# Patient Record
Sex: Male | Born: 1950 | Race: White | Hispanic: No | Marital: Married | Smoking: Former smoker
Health system: Southern US, Community
[De-identification: ages and names within clinical notes are randomized; demographics above are authoritative.]

## PROBLEM LIST (undated history)

## (undated) DIAGNOSIS — K635 Polyp of colon: Secondary | ICD-10-CM

## (undated) DIAGNOSIS — K8020 Calculus of gallbladder without cholecystitis without obstruction: Secondary | ICD-10-CM

## (undated) DIAGNOSIS — M1990 Unspecified osteoarthritis, unspecified site: Secondary | ICD-10-CM

## (undated) DIAGNOSIS — K5790 Diverticulosis of intestine, part unspecified, without perforation or abscess without bleeding: Secondary | ICD-10-CM

## (undated) DIAGNOSIS — E785 Hyperlipidemia, unspecified: Secondary | ICD-10-CM

## (undated) DIAGNOSIS — I2510 Atherosclerotic heart disease of native coronary artery without angina pectoris: Secondary | ICD-10-CM

## (undated) DIAGNOSIS — F419 Anxiety disorder, unspecified: Secondary | ICD-10-CM

## (undated) DIAGNOSIS — I10 Essential (primary) hypertension: Secondary | ICD-10-CM

## (undated) DIAGNOSIS — H269 Unspecified cataract: Secondary | ICD-10-CM

## (undated) DIAGNOSIS — E039 Hypothyroidism, unspecified: Secondary | ICD-10-CM

## (undated) HISTORY — DX: Atherosclerotic heart disease of native coronary artery without angina pectoris: I25.10

## (undated) HISTORY — DX: Unspecified osteoarthritis, unspecified site: M19.90

## (undated) HISTORY — PX: OTHER SURGICAL HISTORY: SHX169

## (undated) HISTORY — DX: Hypothyroidism, unspecified: E03.9

## (undated) HISTORY — DX: Hyperlipidemia, unspecified: E78.5

## (undated) HISTORY — PX: COLONOSCOPY: SHX174

## (undated) HISTORY — PX: HERNIA REPAIR: SHX51

## (undated) HISTORY — DX: Diverticulosis of intestine, part unspecified, without perforation or abscess without bleeding: K57.90

## (undated) HISTORY — PX: EYE SURGERY: SHX253

## (undated) HISTORY — PX: CHOLECYSTECTOMY: SHX55

## (undated) HISTORY — DX: Polyp of colon: K63.5

## (undated) HISTORY — DX: Essential (primary) hypertension: I10

## (undated) HISTORY — DX: Calculus of gallbladder without cholecystitis without obstruction: K80.20

## (undated) HISTORY — DX: Anxiety disorder, unspecified: F41.9

## (undated) HISTORY — DX: Unspecified cataract: H26.9

---

## 1991-08-17 HISTORY — PX: APPENDECTOMY: SHX54

## 2002-09-14 ENCOUNTER — Ambulatory Visit (HOSPITAL_BASED_OUTPATIENT_CLINIC_OR_DEPARTMENT_OTHER): Admission: RE | Admit: 2002-09-14 | Discharge: 2002-09-14 | Payer: Self-pay | Admitting: Orthopedic Surgery

## 2006-07-01 ENCOUNTER — Ambulatory Visit (HOSPITAL_COMMUNITY): Admission: RE | Admit: 2006-07-01 | Discharge: 2006-07-01 | Payer: Self-pay | Admitting: Surgery

## 2007-07-17 ENCOUNTER — Ambulatory Visit (HOSPITAL_COMMUNITY): Admission: RE | Admit: 2007-07-17 | Discharge: 2007-07-17 | Payer: Self-pay | Admitting: Family Medicine

## 2011-01-01 NOTE — Op Note (Signed)
Timothy Timothy Anthony, Timothy Anthony NO.:  [id number]   MEDICAL RECORD NO.:  [id number]          PATIENT TYPE:  AMB   LOCATION:  DAY                          FACILITY:  [facility]   PHYSICIAN:  Thornton Park. Daphine Deutscher, MD  DATE OF BIRTH:  Dec 28, 1950   DATE OF PROCEDURE:  07/01/2006  DATE OF DISCHARGE:                                 OPERATIVE REPORT   PREOP DIAGNOSIS:  Right lower quadrant abdominal pain in an old hernia  repair.  Timothy Timothy Anthony had a previous ruptured appendix repaired through a  paramedian incision; and developed a series of hernias in that location.  In  1995 this was repaired with mesh at some other Hospital; and he subsequently  came to be cared for 10 years later by Dr. Wilber Bihari in [place],  [place] where he underwent an incisional hernia repair with Marlex  mesh.  Dr. Vonita Moss described a large sac with a hernia that was about 12 cm  long; that he went ahead and freed up and closed, with a running continuous  #1 Novofil.  The Marlex mesh was then sutured on top of that to reinforce  that.  Timothy Timothy Anthony had seen me regarding pain that he would develop after  being on his feet for long periods of time.  He was concerned about this  hernia repair and what could be causing his pain.   POSTOPERATIVE DIAGNOSIS:  Probable fascial tearing at site of running  Novofil suture.   PROCEDURE:  Laparoscopy and removal of continuous suture.   SURGEON:  Thornton Park. Daphine Deutscher, MD   ANESTHESIA:  General endotracheal.   DESCRIPTION OF PROCEDURE:  Timothy Timothy Anthony was taken to room #1 on Friday  afternoon, July 01, 2006 and given general anesthesia.  The abdomen was  prepped with Techni-Care and draped sterilely.  I did shave the right lower  quadrant and a spot in the left upper quadrant from the OptiVu.  An 11-mm  OptiVu was used; and the abdomen was entered without difficulty and  inflated.  Two other little 5-mm ports were placed below that on the left  side.  I then  surveyed the abdomen; and then saw on the left side where I  had marked his area for pain, this running continuous suture that, in spots,  was pulling through; and could no doubt be causing pain similar to the  cheese cutter effect, where the permanent suture cuts through the fascia.   I found a knot in an area where this seemed to have torn through the most;  and I cut out that knot, freeing both ends.  I then removed this Prolene  from most of the distance of this closure.  This seemed to remove the knots  as for pain in the area that he was most tender.  I went ahead and injected  the lateral most part with some Marcaine.  Everything else appeared to be  intact.  Although this area is somewhat attenuated because the muscle is  pretty well gone; and there is mesh overlying it.  I did not see a discrete  hernia for which I would do any specific surgical repair.  Following injection, I deflated the abdomen and closed the wounds with 4-0  Vicryl.  The patient seemed to tolerate the procedure well.  He will be  given Percocet 5/325 (#30) to take for pain; and he will be followed up in  the office in about 3-4 weeks.      Thornton Park Daphine Deutscher, MD  Electronically Signed     MBM/MEDQ  D:  07/01/2006  T:  07/01/2006  Job:  [id number]

## 2011-01-01 NOTE — Op Note (Signed)
NAMECHASTIN, RIESGO NO.:  [id number]   MEDICAL RECORD NO.:  [id number]                   PATIENT TYPE:  AMB   LOCATION:  DSC                                  FACILITY:  [facility]   PHYSICIAN:  Katy Fitch. Naaman Plummer., M.D.          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 Date of injury  approximately six weeks prior.   POSTOPERATIVE DIAGNOSES:  Chronic extensor lag right index finger status  post laceration dorsal aspect of right index finger DIP joint. Date of  injury approximately six weeks prior.   OPERATION:  Delayed reconstruction of right index finger distal extensor  tendon overlying distal metaphysis of middle phalanx with 0.045 inch  Kirschner wire fixation of index finger at 5 degrees hyperextension at DIP  joint.   SURGEON:  Katy Fitch. Sypher, M.D.   ASSISTANT:  Jonni Sanger, P.A.   ANESTHESIA:  General LMA supervised by Janetta Hora. Gelene Mink, M.D.   INDICATIONS FOR PROCEDURE:  Timothy Anthony is a 60 year old gentleman who is  employed by a Nurse, learning disability as a Hospital doctor.   Approximately six weeks prior, he sustained a laceration to the dorsal  aspect of his right index finger proximal to the DIP joint. He did not seek  medical attention and allow this wound to heal. He was noted to have a  chronic extensor lag and ultimately sought medical attention and is referred  for hand surgery consult. He was noted to have signs of a chronic extensor  laceration. X-rays demonstrated no sign of osteomyelitis or significant bone  or joint pathology.   We recommended delayed reconstruction of his extensor at this time.   Preoperatively he was advised to anticipate Kirschner wire internal fixation  of the DIP joint at 5  degrees hyperextension to allow satisfactory wound  contracture/star contracture to prevent chronic extensor lag with a distal  extensor repair.   DESCRIPTION OF PROCEDURE:  Jak Haggar was brought to the operating room  and placed in the supine position on the operating table.   Following induction of general anesthesia, the right arm was prepped with  Betadine solution and sterilely draped.   On exsanguination of the limb with an Esmarch bandage, a total tourniquet on  the proximal brachial was inflated to 220 mmHg. The procedure commenced with  a curvilinear excision exposing the extensor mechanism from the diaphysis of  the middle phalanx distally to the nail matrix.   A considerable amount of scar was noted. The scar was carefully dissected  revealing the proximal stump of the tendon at the distal metaphysis of the  middle phalanx. The distal tendon was noted just proximal to the DIP joint.  The area of tendon gap was carefully resected with a scalpel and the  proximal tendon stump tenolysed with the aid of fine tenotomy scissors.   The proximal tendon stump was mobilized followed by placement of multiple  grasping sutures of 4-0 Mersilene and a running finishing suture of 4-0  Mersilene repairing the tendon anatomically. The DIP joint was hyperextended  5 degrees and fixed with a 0.045 inch Kirschner wire placed with a mini  driver.   Satisfactory internal fixation was achieved.   The tendon repair was finished followed by repair of the skin with  intradermal 2-0 Prolene suture.   A compressive dressing applied with Xero-Plus sterile gauze and Coban as  well as an __________ splint maintaining the PIP joint in full extension and  the DIP joint in full extension.   There were no apparent complications.   Mr. Stutsman tolerated the surgery and anesthesia well. He was awakened from  anesthesia and transferred to the recovery room with stable vital signs. He  was given  prescriptions for Percocet 5 mg, 1-2 tablets p.o. q. 4-6 hours  p.r.n. pain, 20 tablets without refill, also Keflex 500 mg one p.o. q. 8  hours x4 days as prophylactic antibiotic.                                               Katy Fitch Naaman Plummer., M.D.    RVS/MEDQ  D:  09/14/2002  T:  09/14/2002  Job:  [id number]

## 2016-01-13 ENCOUNTER — Ambulatory Visit (INDEPENDENT_AMBULATORY_CARE_PROVIDER_SITE_OTHER): Payer: Medicare Other | Admitting: Pediatrics

## 2016-01-13 ENCOUNTER — Encounter: Payer: Self-pay | Admitting: Pediatrics

## 2016-01-13 VITALS — BP 140/86 | HR 64 | Temp 98.3°F | Resp 16 | Ht 72.64 in | Wt 246.0 lb

## 2016-01-13 DIAGNOSIS — T7800XA Anaphylactic reaction due to unspecified food, initial encounter: Secondary | ICD-10-CM | POA: Insufficient documentation

## 2016-01-13 DIAGNOSIS — K219 Gastro-esophageal reflux disease without esophagitis: Secondary | ICD-10-CM | POA: Diagnosis not present

## 2016-01-13 HISTORY — DX: Anaphylactic reaction due to unspecified food, initial encounter: T78.00XA

## 2016-01-13 HISTORY — DX: Gastro-esophageal reflux disease without esophagitis: K21.9

## 2016-01-13 NOTE — Patient Instructions (Addendum)
Avoid peanuts until we can do a gradual oral challenge If you have an allergic reaction take Benadryl 50 mg every 4 hours and if you have life-threatening symptoms inject  with EpiPen 0.3 mg Avoid Aleve, ibuprofen, aspirin and related compounds.. You may use acetaminophen or Tylenol If you need a nonsteroidal medication for pain or arthritis, we could try a gradual oral challenge to meloxicam or Celebrex

## 2016-01-13 NOTE — Progress Notes (Signed)
[address] [place] [address] Dept: [phone]  New Patient Note  Patient ID: Timothy Anthony, male    DOB: 06/18/1951  Age: 65 y.o. MRN: [id number] Date of Office Visit: 01/13/2016 Referring provider: Jamal CollinSuzanne Hedgecock, PA-C [address] [place], [place] [address]    Chief Complaint: Allergic Reaction and Food Intolerance  HPI Timothy Anthony presents for evaluation of 3 different episodes when he ate peanut butter on  one occasion , and peanuts from a  bag on 2 other occasions and developed abdominal cramps and nausea. For the past 9 months he has had a several episodes of chest tightness and abdominal pain after taking Aleve. He has a history of mild gastroesophageal reflux. He has not had eczema, asthma, or urticaria. With the episodes with Aleve he did not have urticaria.  Review of Systems  Constitutional: Negative.   HENT: Negative.   Eyes: Negative.   Respiratory: Negative.   Cardiovascular: Negative.   Gastrointestinal:       Gastroesophageal reflux. Appendectomy. 6 repairs of an abdominal hernia  Genitourinary: Negative.   Musculoskeletal: Negative.   Skin: Negative.   Neurological: Negative.   Endo/Heme/Allergies:       Hypothyroidism. No diabetes  Psychiatric/Behavioral: Negative.     Outpatient Encounter Prescriptions as of 01/13/2016  Medication Sig  . levothyroxine (SYNTHROID, LEVOTHROID) 175 MCG tablet Take by mouth.  . Omega-3 Fatty Acids (FISH OIL) 1000 MG CAPS Take by mouth.   No facility-administered encounter medications on file as of 01/13/2016.     Drug Allergies:  No Known Allergies  Family History: Link's family history is negative for Allergic rhinitis, Angioedema, Asthma, Eczema, Immunodeficiency, and Urticaria..  Social and environmental.. There are no pets in the home. He is not exposed to cigarette smoking. He is disabled because of his surgeries. He smoked cigarettes for 8 years in the past averaging about half a pack  a day  Physical Exam: BP 140/86 mmHg  Pulse 64  Temp(Src) 98.3 F (36.8 C) (Oral)  Resp 16  Ht 6' 0.64" (1.845 m)  Wt 246 lb 0.5 oz (111.6 kg)  BMI 32.78 kg/m2   Physical Exam  Constitutional: He is oriented to person, place, and time. He appears well-developed and well-nourished.  HENT:  Eyes normal. Ears normal. Nose normal. Pharynx normal.  Neck: Neck supple. No thyromegaly present.  Cardiovascular:  S1 and S2 normal no murmurs  Pulmonary/Chest:  Clear to percussion auscultation  Abdominal: Soft. There is no tenderness (no  hepatosplenomegaly).  Lymphadenopathy:    He has no cervical adenopathy.  Neurological: He is alert and oriented to person, place, and time.  Skin:  Clear  Psychiatric: He has a normal mood and affect. His behavior is normal. Judgment and thought content normal.  Vitals reviewed.   Diagnostics: Allergy skin testing to foods it did not show a significant reactivity. Skin testing to peanut was negative   Assessment Assessment and Plan: 1. Allergy with anaphylaxis due to food, initial encounter   2. Gastroesophageal reflux disease without esophagitis   3 .    Adverse reaction to Aleve but no urticaria so that a gradual oral challenge in the future to meloxicam or Celebrex could be done.      Patient Instructions  Avoid peanuts until we can do a gradual oral challenge If you have an allergic reaction take Benadryl 50 mg every 4 hours and if you have life-threatening symptoms inject  with EpiPen 0.3 mg Avoid Aleve, ibuprofen, aspirin  and related compounds.. You may use acetaminophen or Tylenol If you need a nonsteroidal medication for pain or arthritis, we could try a gradual oral challenge to meloxicam    Return if symptoms worsen or fail to improve.   Thank you for the opportunity to care for this patient.  Please do not hesitate to contact me with questions.  Tonette Bihari, M.D.  [facility] [address] [place], [place] [address] [phone]

## 2018-01-31 DIAGNOSIS — E039 Hypothyroidism, unspecified: Secondary | ICD-10-CM

## 2018-01-31 HISTORY — DX: Hypothyroidism, unspecified: E03.9

## 2019-05-28 DIAGNOSIS — R143 Flatulence: Secondary | ICD-10-CM | POA: Insufficient documentation

## 2019-05-28 HISTORY — DX: Flatulence: R14.3

## 2020-08-07 DIAGNOSIS — E782 Mixed hyperlipidemia: Secondary | ICD-10-CM

## 2020-08-07 HISTORY — DX: Mixed hyperlipidemia: E78.2

## 2021-08-24 DIAGNOSIS — K439 Ventral hernia without obstruction or gangrene: Secondary | ICD-10-CM

## 2021-08-24 DIAGNOSIS — I10 Essential (primary) hypertension: Secondary | ICD-10-CM | POA: Insufficient documentation

## 2021-08-24 HISTORY — DX: Essential (primary) hypertension: I10

## 2021-08-24 HISTORY — DX: Ventral hernia without obstruction or gangrene: K43.9

## 2022-08-13 ENCOUNTER — Emergency Department (HOSPITAL_BASED_OUTPATIENT_CLINIC_OR_DEPARTMENT_OTHER)
Admission: EM | Admit: 2022-08-13 | Discharge: 2022-08-13 | Disposition: A | Discharge status: Home / Self Care | Payer: Medicare Other | Attending: Emergency Medicine | Admitting: Emergency Medicine

## 2022-08-13 ENCOUNTER — Emergency Department (HOSPITAL_BASED_OUTPATIENT_CLINIC_OR_DEPARTMENT_OTHER): Payer: Medicare Other

## 2022-08-13 ENCOUNTER — Encounter (HOSPITAL_BASED_OUTPATIENT_CLINIC_OR_DEPARTMENT_OTHER): Payer: Self-pay | Admitting: Pediatrics

## 2022-08-13 ENCOUNTER — Other Ambulatory Visit: Payer: Self-pay

## 2022-08-13 DIAGNOSIS — R002 Palpitations: Secondary | ICD-10-CM | POA: Diagnosis present

## 2022-08-13 LAB — CBC
HCT: 42.3 % (ref 39.0–52.0)
Hemoglobin: 15.4 g/dL (ref 13.0–17.0)
MCH: 34.6 pg — ABNORMAL HIGH (ref 26.0–34.0)
MCHC: 36.4 g/dL — ABNORMAL HIGH (ref 30.0–36.0)
MCV: 95.1 fL (ref 80.0–100.0)
Platelets: 200 10*3/uL (ref 150–400)
RBC: 4.45 MIL/uL (ref 4.22–5.81)
RDW: 11 % — ABNORMAL LOW (ref 11.5–15.5)
WBC: 5.1 10*3/uL (ref 4.0–10.5)
nRBC: 0 % (ref 0.0–0.2)

## 2022-08-13 LAB — BASIC METABOLIC PANEL
Anion gap: 8 (ref 5–15)
BUN: 19 mg/dL (ref 8–23)
CO2: 24 mmol/L (ref 22–32)
Calcium: 8.9 mg/dL (ref 8.9–10.3)
Chloride: 106 mmol/L (ref 98–111)
Creatinine, Ser: 1 mg/dL (ref 0.61–1.24)
GFR, Estimated: >60 mL/min (ref >60)
Glucose, Bld: 89 mg/dL (ref 70–99)
Potassium: 3.8 mmol/L (ref 3.5–5.1)
Sodium: 138 mmol/L (ref 135–145)

## 2022-08-13 MED ORDER — HYDROXYZINE HCL 25 MG PO TABS
25.0000 mg | ORAL_TABLET | Freq: Once | ORAL | Status: AC
  Administered 2022-08-13: 25 mg via ORAL
  Filled 2022-08-13: qty 1

## 2022-08-13 MED ORDER — HYDROXYZINE HCL 25 MG PO TABS: 25.0000 mg | ORAL_TABLET | Freq: Four times a day (QID) | ORAL | 0 refills | Status: DC

## 2022-08-13 NOTE — ED Provider Notes (Signed)
[facility] EMERGENCY DEPARTMENT Provider Note   CSN: [id number] Arrival date & time: 08/13/22  1529     History  Chief Complaint  Patient presents with   Palpitations    Timothy Anthony is a 71 y.o. male.  Patient here with palpitations on and off for the last few days.  Has been having a lot of anxiety and stress.  A lot of health problems in the family this past year and has been doing a lot of caregiving.  Denies any chest pain or shortness of breath.  He denies any SI.  He has been some panic attacks/anxiety type symptoms and some palpitations.  Denies any fever or chills.  No cough or sputum production.  The history is provided by the patient.       Home Medications Prior to Admission medications   Medication Sig Start Date End Date Taking? Authorizing Provider  hydrOXYzine (ATARAX) 25 MG tablet Take 1 tablet (25 mg total) by mouth every 6 (six) hours. 08/13/22  Yes Amit Meloy, DO  levothyroxine (SYNTHROID, LEVOTHROID) 175 MCG tablet Take by mouth. 05/14/15 05/13/16  [provider]  Omega-3 Fatty Acids (FISH OIL) 1000 MG CAPS Take by mouth.    [provider]      Allergies    Patient has no known allergies.    Review of Systems   Review of Systems  Physical Exam Updated Vital Signs BP 132/85   Pulse 62   Temp 98.1 F (36.7 C) (Oral)   Resp 14   Ht 6\' 1"  (1.854 m)   Wt 117.9 kg   SpO2 97%   BMI 34.30 kg/m  Physical Exam Vitals and nursing note reviewed.  Constitutional:      General: He is not in acute distress.    Appearance: He is well-developed.  HENT:     Head: Normocephalic and atraumatic.     Nose: Nose normal.     Mouth/Throat:     Mouth: Mucous membranes are moist.  Eyes:     Extraocular Movements: Extraocular movements intact.     Conjunctiva/sclera: Conjunctivae normal.     Pupils: Pupils are equal, round, and reactive to light.  Cardiovascular:     Rate and Rhythm: Normal rate and regular rhythm.      Pulses: Normal pulses.     Heart sounds: Normal heart sounds. No murmur heard. Pulmonary:     Effort: Pulmonary effort is normal. No respiratory distress.     Breath sounds: Normal breath sounds.  Abdominal:     Palpations: Abdomen is soft.     Tenderness: There is no abdominal tenderness.  Musculoskeletal:        General: No swelling.     Cervical back: Neck supple.  Skin:    General: Skin is warm and dry.     Capillary Refill: Capillary refill takes less than 2 seconds.  Neurological:     General: No focal deficit present.     Mental Status: He is alert and oriented to person, place, and time.     Cranial Nerves: No cranial nerve deficit.     Sensory: No sensory deficit.     Motor: No weakness.     Coordination: Coordination normal.  Psychiatric:        Mood and Affect: Mood normal.     ED Results / Procedures / Treatments   Labs (all labs ordered are listed, but only abnormal results are displayed) Labs Reviewed  CBC - Abnormal; Notable for  the following components:      Result Value   MCH 34.6 (*)    MCHC 36.4 (*)    RDW 11.0 (*)    All other components within normal limits  BASIC METABOLIC PANEL    EKG EKG Interpretation  Date/Time:  Friday August 13 2022 15:53:50 EST Ventricular Rate:  71 PR Interval:  205 QRS Duration: 83 QT Interval:  371 QTC Calculation: 404 R Axis:   1 Text Interpretation: Sinus rhythm Low voltage, precordial leads B Confirmed by Lockie Mola, Shenoa Hattabaugh ([id number]) on 08/13/2022 4:30:53 PM  Radiology DG Chest 2 View  Result Date: 08/13/2022 CLINICAL DATA:  Palpitations EXAM: CHEST - 2 VIEW COMPARISON:  11/19/2015 FINDINGS: Cardiac size is within normal limits. Thoracic aorta is tortuous and ectatic. There are no signs of pulmonary edema or focal pulmonary consolidation. Small transverse linear density in the lateral aspect of left lower lung fields has not changed suggesting scarring. There is no pleural effusion or pneumothorax. IMPRESSION: No  active cardiopulmonary disease. Electronically Signed   By: Ernie Avena M.D.   On: 08/13/2022 16:13    Procedures Procedures    Medications Ordered in ED Medications  hydrOXYzine (ATARAX) tablet 25 mg (has no administration in time range)    ED Course/ Medical Decision Making/ A&P                           Medical Decision Making Amount and/or Complexity of Data Reviewed Labs: ordered. Radiology: ordered.  Risk Prescription drug management.   Timothy Anthony is here with palpitations.  Normal vitals.  No fever.  No significant comorbidities.  Normal vitals.  EKG shows may be some PVCs but otherwise sinus rhythm.  No ischemic changes.  Lab work collected showed no significant anemia or electrolyte abnormality or kidney injury.  Differential is likely stress or symptomatic PVCs.  Recommend stress reduction, rest, avoidance of caffeine.  I have no concern for ACS or PE or other acute process.  Chest x-ray done per my review interpretation shows no evidence of pneumonia or pneumothorax.  Will prescribe Vistaril for anxiety to take as needed.  He has PCP follow-up to discuss further treatment of anxiety.  If he continues to have palpitations have given him number for cardiology for possible heart monitor.  Discharged in good condition.  Understands return precautions.  This chart was dictated using voice recognition software.  Despite best efforts to proofread,  errors can occur which can change the documentation meaning.         Final Clinical Impression(s) / ED Diagnoses Final diagnoses:  Palpitations    Rx / DC Orders ED Discharge Orders          Ordered    hydrOXYzine (ATARAX) 25 MG tablet  Every 6 hours        08/13/22 1715              Virgina Norfolk, DO 08/13/22 1717

## 2022-08-13 NOTE — ED Triage Notes (Signed)
C/O palpitations, nausea, headache while doing some painting;
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
## 2022-08-13 NOTE — ED Notes (Signed)
ED Provider at bedside. 

## 2022-08-15 ENCOUNTER — Other Ambulatory Visit: Payer: Self-pay

## 2022-08-15 ENCOUNTER — Emergency Department (HOSPITAL_BASED_OUTPATIENT_CLINIC_OR_DEPARTMENT_OTHER)
Admission: EM | Admit: 2022-08-15 | Discharge: 2022-08-16 | Disposition: A | Discharge status: Home / Self Care | Payer: Medicare Other | Attending: Emergency Medicine | Admitting: Emergency Medicine

## 2022-08-15 ENCOUNTER — Encounter (HOSPITAL_BASED_OUTPATIENT_CLINIC_OR_DEPARTMENT_OTHER): Payer: Self-pay

## 2022-08-15 DIAGNOSIS — F419 Anxiety disorder, unspecified: Secondary | ICD-10-CM | POA: Diagnosis present

## 2022-08-15 DIAGNOSIS — I10 Essential (primary) hypertension: Secondary | ICD-10-CM | POA: Insufficient documentation

## 2022-08-15 LAB — COMPREHENSIVE METABOLIC PANEL
ALT: 22 U/L (ref 0–44)
AST: 25 U/L (ref 15–41)
Albumin: 4.1 g/dL (ref 3.5–5.0)
Alkaline Phosphatase: 49 U/L (ref 38–126)
Anion gap: 8 (ref 5–15)
BUN: 16 mg/dL (ref 8–23)
CO2: 23 mmol/L (ref 22–32)
Calcium: 9.2 mg/dL (ref 8.9–10.3)
Chloride: 107 mmol/L (ref 98–111)
Creatinine, Ser: 0.85 mg/dL (ref 0.61–1.24)
GFR, Estimated: >60 mL/min (ref >60)
Glucose, Bld: 100 mg/dL — ABNORMAL HIGH (ref 70–99)
Potassium: 3.8 mmol/L (ref 3.5–5.1)
Sodium: 138 mmol/L (ref 135–145)
Total Bilirubin: 0.4 mg/dL (ref 0.3–1.2)
Total Protein: 7 g/dL (ref 6.5–8.1)

## 2022-08-15 LAB — CBC WITH DIFFERENTIAL/PLATELET
Abs Immature Granulocytes: 0.02 10*3/uL (ref 0.00–0.07)
Basophils Absolute: 0 10*3/uL (ref 0.0–0.1)
Basophils Relative: 0 %
Eosinophils Absolute: 0.2 10*3/uL (ref 0.0–0.5)
Eosinophils Relative: 3 %
HCT: 43.7 % (ref 39.0–52.0)
Hemoglobin: 15.9 g/dL (ref 13.0–17.0)
Immature Granulocytes: 0 %
Lymphocytes Relative: 31 %
Lymphs Abs: 1.9 10*3/uL (ref 0.7–4.0)
MCH: 34.5 pg — ABNORMAL HIGH (ref 26.0–34.0)
MCHC: 36.4 g/dL — ABNORMAL HIGH (ref 30.0–36.0)
MCV: 94.8 fL (ref 80.0–100.0)
Monocytes Absolute: 0.4 10*3/uL (ref 0.1–1.0)
Monocytes Relative: 7 %
Neutro Abs: 3.5 10*3/uL (ref 1.7–7.7)
Neutrophils Relative %: 59 %
Platelets: 212 10*3/uL (ref 150–400)
RBC: 4.61 MIL/uL (ref 4.22–5.81)
RDW: 11.1 % — ABNORMAL LOW (ref 11.5–15.5)
WBC: 6 10*3/uL (ref 4.0–10.5)
nRBC: 0 % (ref 0.0–0.2)

## 2022-08-15 LAB — T4, FREE: Free T4: 1.07 ng/dL (ref 0.61–1.12)

## 2022-08-15 LAB — TSH: TSH: 3.631 u[IU]/mL (ref 0.350–4.500)

## 2022-08-15 MED ORDER — LORAZEPAM 1 MG PO TABS: 1.0000 mg | ORAL_TABLET | Freq: Three times a day (TID) | ORAL | 0 refills | Status: DC | PRN

## 2022-08-15 MED ORDER — LORAZEPAM 1 MG PO TABS
1.0000 mg | ORAL_TABLET | Freq: Once | ORAL | Status: AC
  Administered 2022-08-15: 1 mg via ORAL
  Filled 2022-08-15: qty 1

## 2022-08-15 NOTE — ED Provider Notes (Signed)
  [facility] EMERGENCY DEPARTMENT Provider Note   CSN: [id number] Arrival date & time: 08/15/22  1430     History {Add pertinent medical, surgical, social history, OB history to HPI:1} Chief Complaint  Patient presents with   Anxiety    Timothy Anthony is a 70 y.o. male.   Anxiety       Home Medications Prior to Admission medications   Medication Sig Start Date End Date Taking? Authorizing Provider  hydrOXYzine (ATARAX) 25 MG tablet Take 1 tablet (25 mg total) by mouth every 6 (six) hours. 08/13/22   Virgina Norfolk, DO  levothyroxine (SYNTHROID, LEVOTHROID) 175 MCG tablet Take by mouth. 05/14/15 05/13/16  [provider]  Omega-3 Fatty Acids (FISH OIL) 1000 MG CAPS Take by mouth.    [provider]      Allergies    Patient has no known allergies.    Review of Systems   Review of Systems  Physical Exam Updated Vital Signs BP (!) 129/91 (BP Location: Left Arm)   Pulse 67   Temp 97.6 F (36.4 C) (Oral)   Resp 18   Ht 6\' 1"  (1.854 m)   Wt 117.9 kg   SpO2 100%   BMI 34.30 kg/m  Physical Exam  ED Results / Procedures / Treatments   Labs (all labs ordered are listed, but only abnormal results are displayed) Labs Reviewed  CBC WITH DIFFERENTIAL/PLATELET  COMPREHENSIVE METABOLIC PANEL  TSH  T4, FREE    EKG None  Radiology No results found.  Procedures Procedures  {Document cardiac monitor, telemetry assessment procedure when appropriate:1}  Medications Ordered in ED Medications - No data to display  ED Course/ Medical Decision Making/ A&P                           Medical Decision Making Amount and/or Complexity of Data Reviewed Labs: ordered.   ***  {Document critical care time when appropriate:1} {Document review of labs and clinical decision tools ie heart score, Chads2Vasc2 etc:1}  {Document your independent review of radiology images, and any outside records:1} {Document your discussion with family members,  caretakers, and with consultants:1} {Document social determinants of health affecting pt's care:1} {Document your decision making why or why not admission, treatments were needed:1} Final Clinical Impression(s) / ED Diagnoses Final diagnoses:  None    Rx / DC Orders ED Discharge Orders     None

## 2022-08-15 NOTE — ED Triage Notes (Signed)
States was here on 12/29 diagnosed with anxiety, was given atarax, states been taking without relief and continues to have panic attacks. Last took atarax an hour ago.

## 2022-08-18 ENCOUNTER — Encounter: Payer: Self-pay | Admitting: Medical

## 2022-08-18 ENCOUNTER — Ambulatory Visit (INDEPENDENT_AMBULATORY_CARE_PROVIDER_SITE_OTHER): Payer: Medicare Other | Admitting: Medical

## 2022-08-18 VITALS — BP 130/77 | HR 72 | Ht 73.0 in | Wt 265.0 lb

## 2022-08-18 DIAGNOSIS — F419 Anxiety disorder, unspecified: Secondary | ICD-10-CM | POA: Diagnosis not present

## 2022-08-18 DIAGNOSIS — R739 Hyperglycemia, unspecified: Secondary | ICD-10-CM

## 2022-08-18 DIAGNOSIS — E039 Hypothyroidism, unspecified: Secondary | ICD-10-CM | POA: Diagnosis not present

## 2022-08-18 DIAGNOSIS — I10 Essential (primary) hypertension: Secondary | ICD-10-CM

## 2022-08-18 DIAGNOSIS — Z1211 Encounter for screening for malignant neoplasm of colon: Secondary | ICD-10-CM

## 2022-08-18 LAB — LIPID PANEL
Cholesterol: 149 mg/dL (ref 0–200)
HDL: 28.7 mg/dL — ABNORMAL LOW (ref >39.00)
LDL Cholesterol: 98 mg/dL (ref 0–99)
NonHDL: 120
Total CHOL/HDL Ratio: 5
Triglycerides: 109 mg/dL (ref 0.0–149.0)
VLDL: 21.8 mg/dL (ref 0.0–40.0)

## 2022-08-18 LAB — HEMOGLOBIN A1C: Hgb A1c MFr Bld: 5.4 % (ref 4.6–6.5)

## 2022-08-18 MED ORDER — SERTRALINE HCL 25 MG PO TABS: 25.0000 mg | ORAL_TABLET | Freq: Every day | ORAL | 0 refills | Status: DC

## 2022-08-18 MED ORDER — LORAZEPAM 0.5 MG PO TABS: 0.5000 mg | ORAL_TABLET | Freq: Three times a day (TID) | ORAL | 0 refills | Status: DC

## 2022-08-18 NOTE — Progress Notes (Signed)
Subjective:    Patient ID: Timothy Anthony, male    DOB: 08/02/1951, 72 y.o.   MRN: [id number]  HPI Pt in for first time.  Pt former truck driver/retired now. Some walking daily but not strenuous. States eating healthy. Non smoker presently. Stopped in 1979. Occasional alcohol in the summer. No caffeine.   Hx of htn, hypothyroid and anxiety.   Htn- controlled on aceon.  High cholesterol- had in past.   Hypothyroid- on levothyroxine 175 mcg daily.  Recent anxiety- pt states since wife bypass surgery bypass surgery in October. States at home alone over holidays and had multiple manic attacks.   ED evaluations. 2 visits. First visit gave hydroxyzine and did not help. 2nd time given ativan. Ativan helped.  "72 year old male with history of hypertension and both thyroidism the presents the ER today secondary to anxiety. Patient was here couple days ago for the same. He has had multiple panic attacks since that time and hydroxyzine's does not seem to help. Has not seen his doctor as he does not really have the ability secondary to the holiday. States there is no real precipitating causes. They just arise out of the blue. He starts having diaphoresis, chest pain, shortness of breath and the feeling is those, close and they can die. Is under a lot of stress with the holidays and other recent events with his wife's health and thinks that these are likely just anxiety and panic attacks."     A/P in ED. Check labs and add on thyroid studies this time just to make sure that he is not having an hypothyroidism.  Patient also observed in the ER for over 9 hours waiting labs without any repeat panic attacks.  I discussed multiple behavioral treatments and breathing methods to help with anxiety and panic attacks but also will try Ativan as needed until you follow-up with your primary doctor.   Low suspicion for any other emergent causes for symptoms at this time."    Pt states since left ED he is  still feeling anxious. But not panic attacks. He has been using ativan about twice a day.    Review of Systems  Constitutional:  Negative for chills, fatigue and fever.  Respiratory:  Negative for cough, chest tightness, wheezing and stridor.   Cardiovascular:  Negative for chest pain and palpitations.  Gastrointestinal:  Negative for abdominal pain and blood in stool.  Musculoskeletal:  Negative for back pain and joint swelling.  Neurological:  Negative for dizziness, numbness and headaches.  Hematological:  Negative for adenopathy. Does not bruise/bleed easily.  Psychiatric/Behavioral:  Negative for behavioral problems, dysphoric mood, sleep disturbance and suicidal ideas. The patient is nervous/anxious.     Past Medical History:  Diagnosis Date   Hypothyroidism      Social History   Socioeconomic History   Marital status: Married    Spouse name: Not on file   Number of children: Not on file   Years of education: Not on file   Highest education level: Not on file  Occupational History   Not on file  Tobacco Use   Smoking status: Former   Smokeless tobacco: Not on file  Substance and Sexual Activity   Alcohol use: Yes   Drug use: No   Sexual activity: Not on file  Other Topics Concern   Not on file  Social History Narrative   Not on file   Social Determinants of Health   Financial Resource Strain: Not on file  Food  Insecurity: Not on file  Transportation Needs: Not on file  Physical Activity: Not on file  Stress: Not on file  Social Connections: Not on file  Intimate Partner Violence: Not on file    Past Surgical History:  Procedure Laterality Date   APPENDECTOMY  1993   calcium deposits removal Bilateral 09/2006 and 09/2015   off the eyelids   HERNIA REPAIR  1995,2003,2005,2006,2011   hernia repairs after each surgery    Family History  Problem Relation Age of Onset   Allergic rhinitis Neg Hx    Angioedema Neg Hx    Asthma Neg Hx    Eczema Neg Hx     Immunodeficiency Neg Hx    Urticaria Neg Hx     No Known Allergies  Current Outpatient Medications on File Prior to Visit  Medication Sig Dispense Refill   hydrOXYzine (ATARAX) 25 MG tablet Take 1 tablet (25 mg total) by mouth every 6 (six) hours. 12 tablet 0   LORazepam (ATIVAN) 1 MG tablet Take 1 tablet (1 mg total) by mouth 3 (three) times daily as needed for anxiety. 15 tablet 0   Omega-3 Fatty Acids (FISH OIL) 1000 MG CAPS Take by mouth.     levothyroxine (SYNTHROID, LEVOTHROID) 175 MCG tablet Take by mouth.     perindopril (ACEON) 4 MG tablet Take 4 mg by mouth daily.     No current facility-administered medications on file prior to visit.    BP 130/77   Pulse 72   Ht 6\' 1"  (1.854 m)   Wt 265 lb (120.2 kg)   SpO2 95%   BMI 34.96 kg/m        Objective:   Physical Exam  General Mental Status- Alert. General Appearance- Not in acute distress.   Skin General: Color- Normal Color. Moisture- Normal Moisture.  Neck Carotid Arteries- Normal color. Moisture- Normal Moisture. No carotid bruits. No JVD.  Chest and Lung Exam Auscultation: Breath Sounds:-Normal.  Cardiovascular Auscultation:Rythm- Regular. Murmurs & Other Heart Sounds:Auscultation of the heart reveals- No Murmurs.  Abdomen Inspection:-Inspeection Normal. Palpation/Percussion:Note:No mass. Palpation and Percussion of the abdomen reveal- Non Tender, Non Distended + BS, no rebound or guarding.   Neurologic Cranial Nerve exam:- CN III-XII intact(No nystagmus), symmetric smile. Strength:- 5/5 equal and symmetric strength both upper and lower extremities.       Assessment & Plan:   Patient Instructions  Htn- bp controlled and on aceon 4 mg daily. Continue daily. Cmp normal recently. Get lipid panel  Hypothyroid- tsh and t4 normal. Continue current dose of levothyroxine.  Elevated sugar. Get A1c today.  For anxiety and recent panic attacks rx sertraline 25 mg #30 1 tab po q day. Will rx lower  dose ativan 0.5 mg to use 1 every 8 hours severe anxiety/panic attack. Better to be on ssri in future and avoid overuse daily ativan use if possible. Will see how you doing forward.  Follow up in one month or sooner if needed.   Mackie Pai, PA-C

## 2022-08-18 NOTE — Patient Instructions (Addendum)
Htn- bp controlled and on aceon 4 mg daily. Continue daily. Cmp normal recently. Get lipid panel  Hypothyroid- tsh and t4 normal. Continue current dose of levothyroxine.  Elevated sugar. Get A1c today.  For anxiety and recent panic attacks rx sertraline 25 mg #30 1 tab po q day. Will rx # 15 lower dose ativan 0.5 mg to use 1 every 8 hours severe anxiety/panic attack. Better to be on ssri in future and avoid overuse daily ativan use if possible. Will see how you doing forward.  Follow up in one month or sooner if needed.

## 2022-09-15 ENCOUNTER — Ambulatory Visit (INDEPENDENT_AMBULATORY_CARE_PROVIDER_SITE_OTHER): Payer: Medicare Other | Admitting: Medical

## 2022-09-15 ENCOUNTER — Encounter: Payer: Self-pay | Admitting: Medical

## 2022-09-15 VITALS — BP 110/80 | HR 40 | Temp 98.0°F | Resp 18 | Ht 73.0 in | Wt 259.6 lb

## 2022-09-15 DIAGNOSIS — I10 Essential (primary) hypertension: Secondary | ICD-10-CM | POA: Diagnosis not present

## 2022-09-15 DIAGNOSIS — E039 Hypothyroidism, unspecified: Secondary | ICD-10-CM | POA: Diagnosis not present

## 2022-09-15 DIAGNOSIS — F419 Anxiety disorder, unspecified: Secondary | ICD-10-CM | POA: Diagnosis not present

## 2022-09-15 MED ORDER — SERTRALINE HCL 25 MG PO TABS: 25.0000 mg | ORAL_TABLET | Freq: Every day | ORAL | 0 refills | Status: DC

## 2022-09-15 NOTE — Progress Notes (Signed)
   Subjective:    Patient ID: Timothy Anthony, male    DOB: Mar 11, 1951, 72 y.o.   MRN: [id number]  HPI  Pt in for follow up. He states sertraline seems to be helping a lot. He states he only needed brief ativan rx only. Now only using sertraline. Pt wife doing well now. Not having adverse side effects.  Htn- bp controlled on Aceon 4 mg daily.   Hypothyroid- on levothyroxine 175 mcg daily.     Review of Systems  Constitutional:  Negative for chills, fatigue and fever.  HENT:  Negative for congestion.   Respiratory:  Negative for cough, shortness of breath and wheezing.   Cardiovascular:  Negative for chest pain and palpitations.  Gastrointestinal:  Negative for abdominal pain, anal bleeding and blood in stool.  Genitourinary:  Negative for dysuria, flank pain and frequency.  Musculoskeletal:  Negative for back pain, joint swelling and myalgias.  Neurological:  Negative for dizziness, numbness and headaches.  Hematological:  Negative for adenopathy. Does not bruise/bleed easily.  Psychiatric/Behavioral:  Negative for behavioral problems and confusion.     Patient Instructions  Anxiety- continue sertraline 25 mg daily. Refill today. Give me update in one month then make decision to stay on same dose or increase. Discussed how could potentially taper off med in future if you feel you get to stable point.  Hypothyroid- thyroid labs normal recently. Continue same dose thyroid med.   Htn- bp controlled on aceon 4 mg daily  Follow up in about 3 months/approximate before you leave for [place].    Mackie Pai, PA-C        Objective:   Physical Exam  General Mental Status- Alert. General Appearance- Not in acute distress.   Skin General: Color- Normal Color. Moisture- Normal Moisture.  Neck Carotid Arteries- Normal color. Moisture- Normal Moisture. No carotid bruits. No JVD.  Chest and Lung Exam Auscultation: Breath Sounds:-Normal.  Cardiovascular Auscultation:Rythm-  Regular. Murmurs & Other Heart Sounds:Auscultation of the heart reveals- No Murmurs.  Abdomen Inspection:-Inspeection Normal. Palpation/Percussion:Note:No mass. Palpation and Percussion of the abdomen reveal- Non Tender, Non Distended + BS, no rebound or guarding.   Neurologic Cranial Nerve exam:- CN III-XII intact(No nystagmus), symmetric smile. Strength:- 5/5 equal and symmetric strength both upper and lower extremities.       Assessment & Plan:   Patient Instructions  Anxiety- continue sertraline 25 mg daily. Refill today. Give me update in one month then make decision to stay on same dose or increase. Discussed how could potentially taper off med in future if you feel you get to stable point.  Hypothyroid- thyroid labs normal recently. Continue same dose thyroid med.   Htn- bp controlled on aceon 4 mg daily  Follow up in about 3 months/approximate before you leave for [place].    Mackie Pai, PA-C

## 2022-09-15 NOTE — Patient Instructions (Addendum)
Anxiety- continue sertraline 25 mg daily. Refill today. Give me update in one month then make decision to stay on same dose or increase. Discussed how could potentially taper off med in future if you feel you get to stable point.  Hypothyroid- thyroid labs normal recently. Continue same dose thyroid med.   Htn- bp controlled on aceon 4 mg daily  Follow up in about 3 months/approximate before you leave for [place].

## 2022-09-20 ENCOUNTER — Ambulatory Visit: Payer: Medicare Other | Admitting: Medical

## 2022-09-30 DIAGNOSIS — H25811 Combined forms of age-related cataract, right eye: Secondary | ICD-10-CM | POA: Diagnosis not present

## 2022-09-30 DIAGNOSIS — H269 Unspecified cataract: Secondary | ICD-10-CM | POA: Diagnosis not present

## 2022-10-14 DIAGNOSIS — H25812 Combined forms of age-related cataract, left eye: Secondary | ICD-10-CM | POA: Diagnosis not present

## 2022-10-14 DIAGNOSIS — H2512 Age-related nuclear cataract, left eye: Secondary | ICD-10-CM | POA: Diagnosis not present

## 2022-10-14 DIAGNOSIS — H269 Unspecified cataract: Secondary | ICD-10-CM | POA: Diagnosis not present

## 2022-11-22 ENCOUNTER — Ambulatory Visit (INDEPENDENT_AMBULATORY_CARE_PROVIDER_SITE_OTHER): Payer: Medicare Other | Admitting: Medical

## 2022-11-22 ENCOUNTER — Ambulatory Visit: Payer: Medicare Other | Admitting: Family Medicine

## 2022-11-22 ENCOUNTER — Other Ambulatory Visit: Payer: Self-pay

## 2022-11-22 ENCOUNTER — Ambulatory Visit (INDEPENDENT_AMBULATORY_CARE_PROVIDER_SITE_OTHER): Payer: Medicare Other | Admitting: *Deleted

## 2022-11-22 VITALS — BP 129/72 | HR 69 | Ht 73.0 in | Wt 266.6 lb

## 2022-11-22 VITALS — BP 129/72 | HR 69 | Resp 18 | Ht 73.0 in | Wt 266.0 lb

## 2022-11-22 VITALS — BP 120/70 | Ht 73.0 in | Wt 260.0 lb

## 2022-11-22 DIAGNOSIS — I10 Essential (primary) hypertension: Secondary | ICD-10-CM

## 2022-11-22 DIAGNOSIS — M24111 Other articular cartilage disorders, right shoulder: Secondary | ICD-10-CM

## 2022-11-22 DIAGNOSIS — Z0000 Encounter for general adult medical examination without abnormal findings: Secondary | ICD-10-CM

## 2022-11-22 DIAGNOSIS — G8929 Other chronic pain: Secondary | ICD-10-CM | POA: Diagnosis not present

## 2022-11-22 DIAGNOSIS — F419 Anxiety disorder, unspecified: Secondary | ICD-10-CM | POA: Diagnosis not present

## 2022-11-22 DIAGNOSIS — Z1211 Encounter for screening for malignant neoplasm of colon: Secondary | ICD-10-CM

## 2022-11-22 DIAGNOSIS — M25511 Pain in right shoulder: Secondary | ICD-10-CM | POA: Diagnosis not present

## 2022-11-22 DIAGNOSIS — E039 Hypothyroidism, unspecified: Secondary | ICD-10-CM

## 2022-11-22 HISTORY — DX: Other articular cartilage disorders, right shoulder: M24.111

## 2022-11-22 MED ORDER — TRIAMCINOLONE ACETONIDE 40 MG/ML IJ SUSP
40.0000 mg | Freq: Once | INTRAMUSCULAR | Status: AC
  Administered 2022-11-22: 40 mg via INTRA_ARTICULAR

## 2022-11-22 NOTE — Patient Instructions (Addendum)
1. Screening for colon cancer Call gi office to get scheduled for colonoscopy upcoming oct or November.  - Ambulatory referral to Gastroenterology  2. Hypertension, unspecified type Pt controlled. Continue- aceon 4 mg daily. Will refill 90 tab rx with 3 refills when due.  3. Anxiety Resolve now. You have ativan available in event of severe anxiety or panic attack.  4. Hypothyroidism, unspecified type  Controlled. Continue 150 mcg daily dose.  Rt shoulder pain chronic. Referral to sports med placed.  Follow up September or October. When back from [place]

## 2022-11-22 NOTE — Progress Notes (Signed)
Subjective:   Timothy Anthony is a 72 y.o. male who presents for Medicare Annual/Subsequent preventive examination.  Review of Systems     Cardiac Risk Factors include: advanced age (>71men, >60 women);obesity (BMI >30kg/m2);male gender     Objective:   129/72 Today's Vitals   11/22/22 0853 11/22/22 0908  BP: (!) 144/76 129/72  Pulse: 73 69  Weight: 266 lb 9.6 oz (120.9 kg)   Height: 6\' 1"  (1.854 m)    Body mass index is 35.17 kg/m.     11/22/2022    8:54 AM 08/15/2022    2:37 PM 08/13/2022    3:47 PM  Advanced Directives  Does Patient Have a Medical Advance Directive? Yes No No  Type of Estate agent of [place];Living will    Copy of Healthcare Power of Attorney in Chart? No - copy requested    Would patient like information on creating a medical advance directive?  No - Patient declined No - Patient declined    Current Medications (verified) Outpatient Encounter Medications as of 11/22/2022  Medication Sig   levothyroxine (SYNTHROID) 150 MCG tablet Take 150 mcg by mouth daily before breakfast.   LORazepam (ATIVAN) 0.5 MG tablet Take 1 tablet (0.5 mg total) by mouth every 8 (eight) hours. (Patient not taking: Reported on 09/15/2022)   perindopril (ACEON) 4 MG tablet Take 4 mg by mouth daily.   sertraline (ZOLOFT) 25 MG tablet Take 1 tablet (25 mg total) by mouth daily.   [DISCONTINUED] hydrOXYzine (ATARAX) 25 MG tablet Take 1 tablet (25 mg total) by mouth every 6 (six) hours.   [DISCONTINUED] levothyroxine (SYNTHROID, LEVOTHROID) 175 MCG tablet Take by mouth.   No facility-administered encounter medications on file as of 11/22/2022.    Allergies (verified) Patient has no known allergies.   History: Past Medical History:  Diagnosis Date   Anxiety    Hyperlipidemia    Hypertension    Hypothyroidism    Past Surgical History:  Procedure Laterality Date   APPENDECTOMY  1993   calcium deposits removal Bilateral 09/2006 and 09/2015   off the  eyelids   HERNIA REPAIR  1995,2003,2005,2006,2011   hernia repairs after each surgery   Family History  Problem Relation Age of Onset   Allergic rhinitis Neg Hx    Angioedema Neg Hx    Asthma Neg Hx    Eczema Neg Hx    Immunodeficiency Neg Hx    Urticaria Neg Hx    Social History   Socioeconomic History   Marital status: Married    Spouse name: Not on file   Number of children: Not on file   Years of education: Not on file   Highest education level: Not on file  Occupational History   Not on file  Tobacco Use   Smoking status: Former    Packs/day: 0.50    Years: 6.00    Additional pack years: 0.00    Total pack years: 3.00    Types: Cigarettes    Quit date: 08/19/1977    Years since quitting: 45.2   Smokeless tobacco: Not on file  Vaping Use   Vaping Use: Never used  Substance and Sexual Activity   Alcohol use: Yes    Comment: rare in summers.   Drug use: No   Sexual activity: Not on file  Other Topics Concern   Not on file  Social History Narrative   Not on file   Social Determinants of Health   Financial Resource Strain: Low  Risk  (11/22/2022)   Overall Financial Resource Strain (CARDIA)    Difficulty of Paying Living Expenses: Not hard at all  Food Insecurity: No Food Insecurity (11/22/2022)   Hunger Vital Sign    Worried About Running Out of Food in the Last Year: Never true    Ran Out of Food in the Last Year: Never true  Transportation Needs: No Transportation Needs (11/22/2022)   PRAPARE - Administrator, Civil ServiceTransportation    Lack of Transportation (Medical): No    Lack of Transportation (Non-Medical): No  Physical Activity: Inactive (11/22/2022)   Exercise Vital Sign    Days of Exercise per Week: 0 days    Minutes of Exercise per Session: 0 min  Stress: No Stress Concern Present (11/22/2022)   [employer] - Occupational Stress Questionnaire    Feeling of Stress : Not at all  Social Connections: Moderately Isolated (11/22/2022)   Social Connection  and Isolation Panel [NHANES]    Frequency of Communication with Friends and Family: Twice a week    Frequency of Social Gatherings with Friends and Family: Once a week    Attends Religious Services: Never    Database administratorActive Member of Clubs or Organizations: No    Attends Engineer, structuralClub or Organization Meetings: Never    Marital Status: Married    Tobacco Counseling Counseling given: Not Answered   Clinical Intake:  Pre-visit preparation completed: Yes  Pain : No/denies pain  BMI - recorded: 35.17 Nutritional Status: BMI > 30  Obese Nutritional Risks: None Diabetes: No  How often do you need to have someone help you when you read instructions, pamphlets, or other written materials from your doctor or pharmacy?: 1 - Never  Activities of Daily Living    11/22/2022    9:02 AM  In your present state of health, do you have any difficulty performing the following activities:  Hearing? 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 0  Comment recent cataract surgery  Difficulty concentrating or making decisions? 0  Walking or climbing stairs? 0  Dressing or bathing? 0  Doing errands, shopping? 0  Preparing Food and eating ? N  Using the Toilet? N  In the past six months, have you accidently leaked urine? N  Do you have problems with loss of bowel control? N  Managing your Medications? N  Managing your Finances? N  Housekeeping or managing your Housekeeping? N    Patient Care Team: Saguier, Kateri McEdward, PA-C as PCP - General (Internal Medicine)  Indicate any recent Medical Services you may have received from other than [facility] providers in the past year (date may be approximate).     Assessment:   This is a routine wellness examination for Timothy Anthony.  Hearing/Vision screen No results found.  Dietary issues and exercise activities discussed: Current Exercise Habits: The patient does not participate in regular exercise at present, Exercise limited by: None identified    Goals Addressed   None    Depression Screen    11/22/2022    8:59 AM 08/18/2022    7:59 AM  PHQ 2/9 Scores  PHQ - 2 Score 0 0    Fall Risk    11/22/2022    8:55 AM 08/18/2022    7:59 AM  Fall Risk   Falls in the past year? 0 0  Number falls in past yr: 0 0  Injury with Fall? 0 0  Risk for fall due to : No Fall Risks No Fall Risks  Follow up Falls evaluation completed     FALL RISK PREVENTION PERTAINING TO THE HOME:  Any stairs in or around the home? Yes  If so, are there any without handrails? No  Home free of loose throw rugs in walkways, pet beds, electrical cords, etc? Yes  Adequate lighting in your home to reduce risk of falls? Yes   ASSISTIVE DEVICES UTILIZED TO PREVENT FALLS:  Life alert? No  Use of a cane, walker or w/c? No  Grab bars in the bathroom? Yes  Shower chair or bench in shower?  Built in bench Elevated toilet seat or a handicapped toilet? No   TIMED UP AND GO:  Was the test performed? Yes .  Length of time to ambulate 10 feet: 5 sec.   Gait steady and fast without use of assistive device  Cognitive Function:        11/22/2022    9:06 AM  6CIT Screen  What Year? 0 points  What month? 0 points  What time? 0 points  Count back from 20 0 points  Months in reverse 0 points  Repeat phrase 0 points  Total Score 0 points    Immunizations  There is no immunization history on file for this patient.  TDAP status: Due, Education has been provided regarding the importance of this vaccine. Advised may receive this vaccine at local pharmacy or Health Dept. Aware to provide a copy of the vaccination record if obtained from local pharmacy or Health Dept. Verbalized acceptance and understanding.  Flu Vaccine status: Declined, Education has been provided regarding the importance of this vaccine but patient still declined. Advised may receive this vaccine at local pharmacy or Health Dept. Aware to provide a copy of the vaccination record if obtained from local  pharmacy or Health Dept. Verbalized acceptance and understanding.  Pneumococcal vaccine status: Declined,  Education has been provided regarding the importance of this vaccine but patient still declined. Advised may receive this vaccine at local pharmacy or Health Dept. Aware to provide a copy of the vaccination record if obtained from local pharmacy or Health Dept. Verbalized acceptance and understanding.   Covid-19 vaccine status: Declined, Education has been provided regarding the importance of this vaccine but patient still declined. Advised may receive this vaccine at local pharmacy or Health Dept.or vaccine clinic. Aware to provide a copy of the vaccination record if obtained from local pharmacy or Health Dept. Verbalized acceptance and understanding.  Qualifies for Shingles Vaccine? Yes   Zostavax completed No   Shingrix Completed?: No.    Education has been provided regarding the importance of this vaccine. Patient has been advised to call insurance company to determine out of pocket expense if they have not yet received this vaccine. Advised may also receive vaccine at local pharmacy or Health Dept. Verbalized acceptance and understanding.  Screening Tests Health Maintenance  Topic Date Due   COVID-19 Vaccine (1) Never done   Hepatitis C Screening  Never done   DTaP/Tdap/Td (1 - Tdap) Never done   Zoster Vaccines- Shingrix (1 of 2) Never done   Medicare Annual Wellness (AWV)  08/24/2022   Pneumonia Vaccine 42+ Years old (1 of 1 - PCV) 08/19/2023 (Originally 03/21/2018)   INFLUENZA VACCINE  03/17/2023   COLONOSCOPY (Pts 45-70yrs Insurance coverage will need to be confirmed)  03/23/2028   HPV VACCINES  Aged Out    Health Maintenance  Health Maintenance Due  Topic Date Due   COVID-19 Vaccine (1) Never done   Hepatitis C  Screening  Never done   DTaP/Tdap/Td (1 - Tdap) Never done   Zoster Vaccines- Shingrix (1 of 2) Never done   Medicare Annual Wellness (AWV)  08/24/2022     Colorectal cancer screening: Type of screening: Colonoscopy. Completed 03/23/18. Repeat every 10 years  Lung Cancer Screening: (Low Dose CT Chest recommended if Age 31-80 years, 30 pack-year currently smoking OR have quit w/in 15years.) does not qualify.   Additional Screening:  Hepatitis C Screening: does qualify; Completed N/a  Vision Screening: Recommended annual ophthalmology exams for early detection of glaucoma and other disorders of the eye. Is the patient up to date with their annual eye exam?  Yes  Who is the provider or what is the name of the office in which the patient attends annual eye exams? Can't remember name at this time If pt is not established with a provider, would they like to be referred to a provider to establish care? No .   Dental Screening: Recommended annual dental exams for proper oral hygiene  Community Resource Referral / Chronic Care Management: CRR required this visit?  No   CCM required this visit?  No      Plan:     I have personally reviewed and noted the following in the patient's chart:   Medical and social history Use of alcohol, tobacco or illicit drugs  Current medications and supplements including opioid prescriptions. Patient is not currently taking opioid prescriptions. Functional ability and status Nutritional status Physical activity Advanced directives List of other physicians Hospitalizations, surgeries, and ER visits in previous 12 months Vitals Screenings to include cognitive, depression, and falls Referrals and appointments  In addition, I have reviewed and discussed with patient certain preventive protocols, quality metrics, and best practice recommendations. A written personalized care plan for preventive services as well as general preventive health recommendations were provided to patient.     Donne Anon, [place]   11/22/2022   Nurse Notes: None

## 2022-11-22 NOTE — Assessment & Plan Note (Signed)
Acute on chronic in nature.  Effusion appreciated within the bicipital tendon sheath but appears most associated with degenerative change of the joint. - counseled on home exercise therapy and supportive care - counseled on compression  - injection today  - could consider PT or further imaging.

## 2022-11-22 NOTE — Patient Instructions (Signed)
Nice to meet you Please alternate heat and ice  Please try the exercises  You can consider the compression   Please send me a message in MyChart with any questions or updates.  Please see me back as needed.   --Dr. Jordan Likes

## 2022-11-22 NOTE — Addendum Note (Signed)
Addended by: Merrilyn Puma on: 11/22/2022 03:08 PM   Modules accepted: Orders

## 2022-11-22 NOTE — Progress Notes (Signed)
  Timothy Anthony - 72 y.o. male MRN [id number]  Date of birth: 06-28-1951  SUBJECTIVE:  Including CC & ROS.  No chief complaint on file.   Timothy Anthony is a 72 y.o. male that is presenting with acute on chronic right shoulder pain.  He has received previous steroid injections and medications.  The pain is intermittent in nature.  He notices the pain the more active that he is.  No injury or inciting event.  No history of surgery.    Review of Systems See HPI   HISTORY: Past Medical, Surgical, Social, and Family History Reviewed & Updated per EMR.   Pertinent Historical Findings include:  Past Medical History:  Diagnosis Date   Anxiety    Hyperlipidemia    Hypertension    Hypothyroidism     Past Surgical History:  Procedure Laterality Date   APPENDECTOMY  1993   calcium deposits removal Bilateral 09/2006 and 09/2015   off the eyelids   HERNIA REPAIR  1995,2003,2005,2006,2011   hernia repairs after each surgery     PHYSICAL EXAM:  VS: BP 120/70   Ht 6\' 1"  (1.854 m)   Wt 260 lb (117.9 kg)   BMI 34.30 kg/m  Physical Exam Gen: NAD, alert, cooperative with exam, well-appearing MSK:  Neurovascularly intact     Aspiration/Injection Procedure Note Timothy Anthony [birth year]  Procedure: Injection Indications: Right shoulder pain  Procedure Details Consent: Risks of procedure as well as the alternatives and risks of each were explained to the (patient/caregiver).  Consent for procedure obtained. Time Out: Verified patient identification, verified procedure, site/side was marked, verified correct patient position, special equipment/implants available, medications/allergies/relevent history reviewed, required imaging and test results available.  Performed.  The area was cleaned with iodine and alcohol swabs.    The right glenohumeral joint was injected using 4 cc of 1% lidocaine and 0.4 cc of 8.4% sodium bicarbonate on a 22-gauge 3-1/2 inch needle.  The syringe was switched to  mixture containing 1 cc's of 40 mg Kenalog and 4 cc's of 0.25% bupivacaine was injected.  Ultrasound was used. Images were obtained in long views showing the injection.     A sterile dressing was applied.  Patient did tolerate procedure well.     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  Effusion appreciated within the bicipital tendon sheath but appears most associated with degenerative change of the joint. - counseled on home exercise therapy and supportive care - counseled on compression  - injection today  - could consider PT or further imaging.

## 2022-11-22 NOTE — Patient Instructions (Signed)
Timothy Anthony , Thank you for taking time to come for your Medicare Wellness Visit. I appreciate your ongoing commitment to your health goals. Please review the following plan we discussed and let me know if I can assist you in the future.   These are the goals we discussed:  Goals   None     This is a list of the screening recommended for you and due dates:  Health Maintenance  Topic Date Due   COVID-19 Vaccine (1) Never done   Hepatitis C Screening: USPSTF Recommendation to screen - Ages 62-79 yo.  Never done   DTaP/Tdap/Td vaccine (1 - Tdap) Never done   Zoster (Shingles) Vaccine (1 of 2) Never done   Pneumonia Vaccine (1 of 1 - PCV) 08/19/2023*   Flu Shot  03/17/2023   Medicare Annual Wellness Visit  11/22/2023   Colon Cancer Screening  03/23/2028   HPV Vaccine  Aged Out  *Topic was postponed. The date shown is not the original due date.     Next appointment: Follow up in one year for your annual wellness visit.   Preventive Care 33 Years and Older, Male Preventive care refers to lifestyle choices and visits with your health care provider that can promote health and wellness. What does preventive care include? A yearly physical exam. This is also called an annual well check. Dental exams once or twice a year. Routine eye exams. Ask your health care provider how often you should have your eyes checked. Personal lifestyle choices, including: Daily care of your teeth and gums. Regular physical activity. Eating a healthy diet. Avoiding tobacco and drug use. Limiting alcohol use. Practicing safe sex. Taking low doses of aspirin every day. Taking vitamin and mineral supplements as recommended by your health care provider. What happens during an annual well check? The services and screenings done by your health care provider during your annual well check will depend on your age, overall health, lifestyle risk factors, and family history of disease. Counseling  Your health care  provider may ask you questions about your: Alcohol use. Tobacco use. Drug use. Emotional well-being. Home and relationship well-being. Sexual activity. Eating habits. History of falls. Memory and ability to understand (cognition). Work and work Astronomer. Screening  You may have the following tests or measurements: Height, weight, and BMI. Blood pressure. Lipid and cholesterol levels. These may be checked every 5 years, or more frequently if you are over 59 years old. Skin check. Lung cancer screening. You may have this screening every year starting at age 82 if you have a 30-pack-year history of smoking and currently smoke or have quit within the past 15 years. Fecal occult blood test (FOBT) of the stool. You may have this test every year starting at age 93. Flexible sigmoidoscopy or colonoscopy. You may have a sigmoidoscopy every 5 years or a colonoscopy every 10 years starting at age 39. Prostate cancer screening. Recommendations will vary depending on your family history and other risks. Hepatitis C blood test. Hepatitis B blood test. Sexually transmitted disease (STD) testing. Diabetes screening. This is done by checking your blood sugar (glucose) after you have not eaten for a while (fasting). You may have this done every 1-3 years. Abdominal aortic aneurysm (AAA) screening. You may need this if you are a current or former smoker. Osteoporosis. You may be screened starting at age 42 if you are at high risk. Talk with your health care provider about your test results, treatment options, and if necessary, the  need for more tests. Vaccines  Your health care provider may recommend certain vaccines, such as: Influenza vaccine. This is recommended every year. Tetanus, diphtheria, and acellular pertussis (Tdap, Td) vaccine. You may need a Td booster every 10 years. Zoster vaccine. You may need this after age 41. Pneumococcal 13-valent conjugate (PCV13) vaccine. One dose is  recommended after age 7. Pneumococcal polysaccharide (PPSV23) vaccine. One dose is recommended after age 31. Talk to your health care provider about which screenings and vaccines you need and how often you need them. This information is not intended to replace advice given to you by your health care provider. Make sure you discuss any questions you have with your health care provider. Document Released: 08/29/2015 Document Revised: 04/21/2016 Document Reviewed: 06/03/2015 Elsevier Interactive Patient Education  2017 [place] Prevention in the Home Falls can cause injuries. They can happen to people of all ages. There are many things you can do to make your home safe and to help prevent falls. What can I do on the outside of my home? Regularly fix the edges of walkways and driveways and fix any cracks. Remove anything that might make you trip as you walk through a door, such as a raised step or threshold. Trim any bushes or trees on the path to your home. Use bright outdoor lighting. Clear any walking paths of anything that might make someone trip, such as rocks or tools. Regularly check to see if handrails are loose or broken. Make sure that both sides of any steps have handrails. Any raised decks and porches should have guardrails on the edges. Have any leaves, snow, or ice cleared regularly. Use sand or salt on walking paths during winter. Clean up any spills in your garage right away. This includes oil or grease spills. What can I do in the bathroom? Use night lights. Install grab bars by the toilet and in the tub and shower. Do not use towel bars as grab bars. Use non-skid mats or decals in the tub or shower. If you need to sit down in the shower, use a plastic, non-slip stool. Keep the floor dry. Clean up any water that spills on the floor as soon as it happens. Remove soap buildup in the tub or shower regularly. Attach bath mats securely with double-sided non-slip rug  tape. Do not have throw rugs and other things on the floor that can make you trip. What can I do in the bedroom? Use night lights. Make sure that you have a light by your bed that is easy to reach. Do not use any sheets or blankets that are too big for your bed. They should not hang down onto the floor. Have a firm chair that has side arms. You can use this for support while you get dressed. Do not have throw rugs and other things on the floor that can make you trip. What can I do in the kitchen? Clean up any spills right away. Avoid walking on wet floors. Keep items that you use a lot in easy-to-reach places. If you need to reach something above you, use a strong step stool that has a grab bar. Keep electrical cords out of the way. Do not use floor polish or wax that makes floors slippery. If you must use wax, use non-skid floor wax. Do not have throw rugs and other things on the floor that can make you trip. What can I do with my stairs? Do not leave any items on the  stairs. Make sure that there are handrails on both sides of the stairs and use them. Fix handrails that are broken or loose. Make sure that handrails are as long as the stairways. Check any carpeting to make sure that it is firmly attached to the stairs. Fix any carpet that is loose or worn. Avoid having throw rugs at the top or bottom of the stairs. If you do have throw rugs, attach them to the floor with carpet tape. Make sure that you have a light switch at the top of the stairs and the bottom of the stairs. If you do not have them, ask someone to add them for you. What else can I do to help prevent falls? Wear shoes that: Do not have high heels. Have rubber bottoms. Are comfortable and fit you well. Are closed at the toe. Do not wear sandals. If you use a stepladder: Make sure that it is fully opened. Do not climb a closed stepladder. Make sure that both sides of the stepladder are locked into place. Ask someone to  hold it for you, if possible. Clearly mark and make sure that you can see: Any grab bars or handrails. First and last steps. Where the edge of each step is. Use tools that help you move around (mobility aids) if they are needed. These include: Canes. Walkers. Scooters. Crutches. Turn on the lights when you go into a dark area. Replace any light bulbs as soon as they burn out. Set up your furniture so you have a clear path. Avoid moving your furniture around. If any of your floors are uneven, fix them. If there are any pets around you, be aware of where they are. Review your medicines with your doctor. Some medicines can make you feel dizzy. This can increase your chance of falling. Ask your doctor what other things that you can do to help prevent falls. This information is not intended to replace advice given to you by your health care provider. Make sure you discuss any questions you have with your health care provider. Document Released: 05/29/2009 Document Revised: 01/08/2016 Document Reviewed: 09/06/2014 Elsevier Interactive Patient Education  2017 [employer].

## 2022-11-22 NOTE — Progress Notes (Signed)
Subjective:    Patient ID: Timothy Anthony, male    DOB: November 26, 1950, 72 y.o.   MRN: [id number]  HPI   Here for follow up.  Last hpi  "Anxiety- continue sertraline 25 mg daily. Refill today. Give me update in one month then make decision to stay on same dose or increase. Discussed how could potentially taper off med in future if you feel you get to stable point.   Hypothyroid- thyroid labs normal recently. Continue same dose thyroid med.     Htn- bp controlled on aceon 4 mg daily   Follow up in about 3 months/approximate before you leave for [place]."  He states anxiety is well controlled. Pt states he tapered off of med over 3-4 day period over one month. Pt also states his mood is better in spring and summer. His wife health problems have resolved.  BP well controlled with aceon 4 mg daily.  Hypothyroid- recent tsh and t4 labs normal.   Pt had covid vaccines x 2.   Pt states won't get pneumnia vaccine. No plan to get covid or flu vaccine.  Pt is due for colonoscopy. In 2024. Pt plans to get done in October or November.   Review of Systems  Constitutional:  Negative for chills, fatigue and fever.  HENT:  Negative for congestion, ear discharge and facial swelling.   Respiratory:  Negative for cough, chest tightness, shortness of breath and wheezing.   Cardiovascular:  Negative for chest pain and palpitations.  Gastrointestinal:  Negative for abdominal pain, anal bleeding, diarrhea and rectal pain.  Genitourinary:  Negative for dysuria, enuresis and flank pain.  Musculoskeletal:  Negative for back pain, joint swelling and myalgias.       Rt shoulder pain. Hx of pain for years. Pt states he gets steroid injecton  about evert 6-8 months. Injectoins  help.   Neurological:  Negative for dizziness, seizures, syncope, speech difficulty, weakness, numbness and headaches.  Hematological:  Negative for adenopathy. Does not bruise/bleed easily.  Psychiatric/Behavioral:  Negative for  behavioral problems and dysphoric mood. The patient is not nervous/anxious and is not hyperactive.     Past Medical History:  Diagnosis Date   Anxiety    Hyperlipidemia    Hypertension    Hypothyroidism      Social History   Socioeconomic History   Marital status: Married    Spouse name: Not on file   Number of children: Not on file   Years of education: Not on file   Highest education level: Not on file  Occupational History   Not on file  Tobacco Use   Smoking status: Former    Packs/day: 0.50    Years: 6.00    Additional pack years: 0.00    Total pack years: 3.00    Types: Cigarettes    Quit date: 08/19/1977    Years since quitting: 45.2   Smokeless tobacco: Not on file  Vaping Use   Vaping Use: Never used  Substance and Sexual Activity   Alcohol use: Yes    Comment: rare in summers.   Drug use: No   Sexual activity: Not on file  Other Topics Concern   Not on file  Social History Narrative   Not on file   Social Determinants of Health   Financial Resource Strain: Low Risk  (11/22/2022)   Overall Financial Resource Strain (CARDIA)    Difficulty of Paying Living Expenses: Not hard at all  Food Insecurity: No Food Insecurity (11/22/2022)  Hunger Vital Sign    Worried About Running Out of Food in the Last Year: Never true    Ran Out of Food in the Last Year: Never true  Transportation Needs: No Transportation Needs (11/22/2022)   PRAPARE - Administrator, Civil Service (Medical): No    Lack of Transportation (Non-Medical): No  Physical Activity: Inactive (11/22/2022)   Exercise Vital Sign    Days of Exercise per Week: 0 days    Minutes of Exercise per Session: 0 min  Stress: No Stress Concern Present (11/22/2022)   [employer] of Occupational Health - Occupational Stress Questionnaire    Feeling of Stress : Not at all  Social Connections: Moderately Isolated (11/22/2022)   Social Connection and Isolation Panel [NHANES]    Frequency of Communication  with Friends and Family: Twice a week    Frequency of Social Gatherings with Friends and Family: Once a week    Attends Religious Services: Never    Database administrator or Organizations: No    Attends Banker Meetings: Never    Marital Status: Married  Catering manager Violence: Not At Risk (11/22/2022)   Humiliation, Afraid, Rape, and Kick questionnaire    Fear of Current or Ex-Partner: No    Emotionally Abused: No    Physically Abused: No    Sexually Abused: No    Past Surgical History:  Procedure Laterality Date   APPENDECTOMY  1993   calcium deposits removal Bilateral 09/2006 and 09/2015   off the eyelids   HERNIA REPAIR  1995,2003,2005,2006,2011   hernia repairs after each surgery    Family History  Problem Relation Age of Onset   Allergic rhinitis Neg Hx    Angioedema Neg Hx    Asthma Neg Hx    Eczema Neg Hx    Immunodeficiency Neg Hx    Urticaria Neg Hx     No Known Allergies  Current Outpatient Medications on File Prior to Visit  Medication Sig Dispense Refill   LORazepam (ATIVAN) 0.5 MG tablet Take 1 tablet (0.5 mg total) by mouth every 8 (eight) hours. (Patient not taking: Reported on 09/15/2022) 30 tablet 0   perindopril (ACEON) 4 MG tablet Take 4 mg by mouth daily.     sertraline (ZOLOFT) 25 MG tablet Take 1 tablet (25 mg total) by mouth daily. 30 tablet 0   No current facility-administered medications on file prior to visit.    BP 129/72   Pulse 69   Resp 18   Ht 6\' 1"  (1.854 m)   Wt 266 lb (120.7 kg)   SpO2 98%   BMI 35.09 kg/m        Objective:   Physical Exam  General Mental Status- Alert. General Appearance- Not in acute distress.   Skin General: Color- Normal Color. Moisture- Normal Moisture.  Neck Carotid Arteries- Normal color. Moisture- Normal Moisture. No carotid bruits. No JVD.  Chest and Lung Exam Auscultation: Breath Sounds:-Normal.  Cardiovascular Auscultation:Rythm- Regular. Murmurs & Other Heart  Sounds:Auscultation of the heart reveals- No Murmurs.  Abdomen Inspection:-Inspeection Normal. Palpation/Percussion:Note:No mass. Palpation and Percussion of the abdomen reveal- Non Tender, Non Distended + BS, no rebound or guarding.   Neurologic Cranial Nerve exam:- CN III-XII intact(No nystagmus), symmetric smile. Strength:- 5/5 equal and symmetric strength both upper and lower extremities.       Assessment & Plan:   Patient Instructions  1. Screening for colon cancer Call gi office to get scheduled for colonoscopy upcoming oct or  November.  - Ambulatory referral to Gastroenterology  2. Hypertension, unspecified type Pt controlled. Continue- aceon.  3. Anxiety   4. Hypothyroidism, unspecified type     Follow up September or October. When back from [place].   Esperanza RichtersEdward Vickey Boak, PA-C

## 2022-11-29 ENCOUNTER — Encounter: Payer: Self-pay | Admitting: *Deleted

## 2023-03-02 ENCOUNTER — Encounter: Payer: Self-pay | Admitting: Pharmacist

## 2023-03-02 ENCOUNTER — Other Ambulatory Visit: Payer: Self-pay | Admitting: Pharmacist

## 2023-03-02 MED ORDER — PERINDOPRIL ERBUMINE 4 MG PO TABS: 4.0000 mg | ORAL_TABLET | Freq: Every day | ORAL | 3 refills | Status: DC

## 2023-03-02 MED ORDER — PERINDOPRIL ERBUMINE 4 MG PO TABS: 4.0000 mg | ORAL_TABLET | Freq: Every day | ORAL | 0 refills | Status: DC

## 2023-03-02 NOTE — Addendum Note (Signed)
Addended by: Gwenevere Abbot on: 03/02/2023 05:01 PM   Modules accepted: Orders

## 2023-03-02 NOTE — Progress Notes (Signed)
Pharmacy Quality Measure Review  This patient is appearing on a report for being at risk of failing the adherence measure for hypertension (ACEi/ARB) medications this calendar year.   Medication: perindopril 4mg   Last fill date: 11/10/2022 for 30 day supply  Spoke with patient and he endorsed that he is taking perindopril regularly but is due to refill soon.  He is checking blood pressure at home and reports blood pressure has been good - usually 130's / 70 or 80's  Reviewed last Rx that was filled and was only sent in for 30 days. Looks like Rx was sent in by patient's previous PCP - Jamal Collin. PAC  Updated Rx for perindopril 4mg  once daily #90 / 0 RF sent to [facility].   Henrene Pastor, PharmD Clinical Pharmacist [facility]

## 2023-05-30 ENCOUNTER — Encounter: Payer: Self-pay | Admitting: Medical

## 2023-05-31 ENCOUNTER — Ambulatory Visit (HOSPITAL_BASED_OUTPATIENT_CLINIC_OR_DEPARTMENT_OTHER)
Admission: RE | Admit: 2023-05-31 | Discharge: 2023-05-31 | Disposition: A | Discharge status: Home / Self Care | Payer: Medicare Other | Source: Ambulatory Visit | Attending: Sports Medicine | Admitting: Sports Medicine

## 2023-05-31 ENCOUNTER — Telehealth: Payer: Self-pay | Admitting: *Deleted

## 2023-05-31 DIAGNOSIS — M25511 Pain in right shoulder: Secondary | ICD-10-CM | POA: Diagnosis not present

## 2023-05-31 DIAGNOSIS — G8929 Other chronic pain: Secondary | ICD-10-CM

## 2023-05-31 DIAGNOSIS — M24111 Other articular cartilage disorders, right shoulder: Secondary | ICD-10-CM

## 2023-05-31 NOTE — Telephone Encounter (Signed)
Xray order placed. Patient advised.

## 2023-05-31 NOTE — Telephone Encounter (Signed)
-----   Message from Marsa Aris sent at 05/31/2023 12:32 PM EDT ----- Regarding: RE: Patient cld to see if new provider would order MRI He needs an x-ray first in order for insurance to cover ----- Message ----- From: Carl Best Sent: 05/31/2023  10:33 AM EDT To: Ralene Cork, DO Subject: FW: Patient cld to see if new provider would#  Hey Dr.Draper... this is one of Schmitz's pt ..he cld to see it he can get an MRI, will you review? ----- Message ----- From: Carl Best Sent: 05/30/2023   3:42 PM EDT To: Carl Best Subject: Patient cld to see if new provider would ord#  Check w/ Dr. Margaretha Sheffield to see if he will order MRI --Pt's LOV w/ Dr. Jordan Likes 11/22/22 say Jordan Likes said he needed one then but pt delayed (he now wants one)

## 2023-07-07 ENCOUNTER — Encounter: Payer: Self-pay | Admitting: Family Medicine

## 2023-07-07 ENCOUNTER — Ambulatory Visit: Payer: Medicare Other | Admitting: Family Medicine

## 2023-07-07 VITALS — BP 130/80 | Ht 73.0 in | Wt 260.0 lb

## 2023-07-07 DIAGNOSIS — G8929 Other chronic pain: Secondary | ICD-10-CM

## 2023-07-07 DIAGNOSIS — M25511 Pain in right shoulder: Secondary | ICD-10-CM | POA: Diagnosis not present

## 2023-07-07 MED ORDER — TRIAMCINOLONE ACETONIDE 40 MG/ML IJ SUSP
40.0000 mg | Freq: Once | INTRAMUSCULAR | Status: AC
  Administered 2023-07-07: 40 mg via INTRA_ARTICULAR

## 2023-07-07 NOTE — Progress Notes (Unsigned)
CHIEF COMPLAINT: No chief complaint on file.  _____________________________________________________________ SUBJECTIVE  HPI  Pt is a 72 y.o. male here for evaluation of R shoulder  first encounter 11/22/22 with Dr Jordan Likes for acute on chronic R shoulder pain, had received an intraarticular GHJI at that time. Since this visit an XR was ordered by Dr. Margaretha Sheffield of the R shoulder for further review   Posterior shoulder pain, sometimes can be anterior.  Therapies tried: has been doing bandwork, therapy, which made it worse. However when he is able to snap the joint the pain is able to go away Right handed  No numbness/tingling Will usually go up to elbow but will sometimes go down arm Going on for several years, around 5 when started going to seek treatment for it  Has had shots in the R shoulder in the past. Gets to the point that it snaps, cracks, and pops, pain will go past his elbow. When he wakes up at night will hear the pain snap/crack and pop, and the pain will go away.   No shoulder surgeries  Activities: handyman around the house, construction, takes care of house. Remodeling house at the time. No sports growing up. Truck driver for several years  Labs reviewed: Last hgba1c 5.4 this year 11/2022 note reviewed, POCUS had been utilized for US guided GHJ injection, identified bicipital tendon sheath effusion ------------------------------------------------------------------------------------------------------   Past Surgical History:  Procedure Laterality Date   APPENDECTOMY  1993   calcium deposits removal Bilateral 09/2006 and 09/2015   off the eyelids   HERNIA REPAIR  1995,2003,2005,2006,2011   hernia repairs after each surgery      No outpatient medications have been marked as taking for the 07/07/23 encounter (Office Visit) with Burna Forts, MD.     ------------------------------------------------------------------------------------------------------  _____________________________________________________________ OBJECTIVE  PHYSICAL EXAM  Today's Vitals   07/07/23 0823  BP: 130/80  Weight: 260 lb (117.9 kg)  Height: 6\' 1"  (1.854 m)   Body mass index is 34.3 kg/m.   reviewed  General: A+Ox3, no acute distress, well-nourished, appropriate affect CV: pulses 2+ regular, nondiaphoretic, no peripheral edema, cap refill <2sec Lungs: no audible wheezing, non-labored breathing, bilateral chest rise/fall, nontachypneic Skin: warm, well-perfused, non-icteric, no susp lesions or rashes Neuro: no focal deficits. Sensation intact, muscle tone wnl, no atrophy Psych: no signs of depression or anxiety MSK:     R Shoulder:  No deformity, swelling or muscle wasting No scapular winging FF 170, pain elicited 100-170, abducts to 160 pain elicited 100-160, + painful arc, reduced extension R compared to L Symmetric ER Discomfort palpating over biceps groove, subacromial space, thoracic/upper back musculature diffusely NTTP over the [place], clavicle, ac, coracoid, humerus, deltoid, scap spine, trap, cervical spine +empty can, speeds, yergason, hornblower Neg neer, hawkins, scarf test, resisted anterior flexion, subscap liftoff, obriens Neg apprehension Negative Spurling's test bilat FROM of neck  05/31/23 R shoulder 2 view XR ordered by Dr. Margaretha Sheffield, independently reviewed by me; preserved GHJ spacing, mild ACJ degenerative changes, no enthesophytes identified or focal pathology _____________________________________________________________ ASSESSMENT/PLAN Diagnoses and all orders for this visit:  Chronic right shoulder pain -     Ambulatory referral to Physical Therapy -     triamcinolone acetonide (KENALOG-40) injection 40 mg  XR reviewed with patient. strength limited by pain on special testing. Suspected chronic RTC/biceps tendinosis.  Differential includes labral pathology. Discussed options for management, proceeded with subacromial bursa injection  Injection Site: right [place] Guided After PARQ discussed and verbal consent given, the site was cleaned  with alcohol. ethyl chloride spray for local anesthesia. Steroid injection was performed using sterile technique with 4mL 1% lidocaine, 1mL 40mg /mL kenalog and 22G 1.5in needle. This was well tolerated and resulted in little relief. Dressing placed and post injection instructions were given including a discussion of likely return of pain today after the anesthetic wears off (with the possibility of worsened pain) until the steroid starts to work in 1-3 days. Call or return to clinic prn if these symptoms worsen or fail to improve as anticipated.  CPT Code: 16[address]  Formal PT referral sent  Patient will followup via mychart if pain recurs/does not improve in 3-4 weeks, will then discuss intraarticular approach as that has helped previously, otherwise f/u PRN  Electronically signed by: Burna Forts, MD 07/07/2023 9:54 AM

## 2023-07-22 ENCOUNTER — Encounter: Payer: Self-pay | Admitting: Physician Assistant

## 2023-07-26 NOTE — Therapy (Addendum)
OUTPATIENT PHYSICAL THERAPY SHOULDER EVALUATION AND DISCHARGE SUMMARY  PHYSICAL THERAPY DISCHARGE SUMMARY  Visits from Start of Care: 1  Current functional level related to goals / functional outcomes: See below   Remaining deficits: See below   Education / Equipment: HEP   Patient is being discharged due to the patient's request. Patient returned for one follow up visit, but refused treatment. He reported to therapist that his HEP was making his pain worse and that he wanted to cancel all remaining PT visits and return to his referring physician. Patient agrees to discharge. Patient goals were not met.    Solon Palm, PT 08/15/23 9:50 AM [facility] [address] [place], [place] [address] [phone]  Fax: [phone]      Patient Name: Timothy Anthony MRN: [id number] DOB:08-28-1950, 72 y.o., male Today's Date: 07/27/2023  END OF SESSION:  PT End of Session - 07/27/23 0803     Visit Number 1    Date for PT Re-Evaluation 09/07/23    Authorization Type Requesting 12 visits    Authorization Time Period 07/27/23-09/07/23    Authorization - Visit Number 1    Progress Note Due on Visit 10    PT Start Time 0803    PT Stop Time 0850    PT Time Calculation (min) 47 min    Activity Tolerance Patient tolerated treatment well    Behavior During Therapy [facility] for tasks assessed/performed             Past Medical History:  Diagnosis Date   Anxiety    Hyperlipidemia    Hypertension    Hypothyroidism    Past Surgical History:  Procedure Laterality Date   APPENDECTOMY  1993   calcium deposits removal Bilateral 09/2006 and 09/2015   off the eyelids   HERNIA REPAIR  1995,2003,2005,2006,2011   hernia repairs after each surgery   Patient Active Problem List   Diagnosis Date Noted   Labral tear of shoulder, degenerative, 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    Gastroesophageal reflux disease without esophagitis 01/13/2016    PCP: Esperanza Richters, PA-C   REFERRING PROVIDER: Burna Forts, MD   REFERRING DIAG: [phone] (ICD-10-CM) - Chronic right shoulder pain   THERAPY DIAG:  Chronic right shoulder pain  Cramp and spasm  Muscle weakness (generalized)  Rationale for Evaluation and Treatment: Rehabilitation  ONSET DATE: 3 years  SUBJECTIVE:  SUBJECTIVE STATEMENT: My shoulder aches . I'm good below shoulder level. The shot helped a lot a couple of weeks ago. But I painted last week and now it's worsening again. Wakes me at night. Normally sleeps on R side. Prior to shot had to get up and sit in the chair 6-7/10. Pain would resolve if he moved it and it cracked and popped. He has had therapy one year ago and the exercises made it worse.   Hand dominance: Right  PERTINENT HISTORY: Chronic R shoulder pain  PAIN:  Are you having pain? Yes: NPRS scale: 3-4/10 Pain location: R shoulder Pain description: achy Aggravating factors: overuse, overhead activity Relieving factors: injections, rest  PRECAUTIONS: None  RED FLAGS: None   WEIGHT BEARING RESTRICTIONS: No  FALLS:  Has patient fallen in last 6 months? No  LIVING ENVIRONMENT: Lives with: lives with their family Lives in: House/apartment   OCCUPATION: Retired; maintains rental units  PLOF: Independent  PATIENT GOALS:I want to find out what's going on with it.   NEXT MD VISIT:   OBJECTIVE:  Note: Objective measures were completed at Evaluation unless otherwise noted.  DIAGNOSTIC FINDINGS:  XR negative  PATIENT SURVEYS:  Quick Dash 22.7 / 100 = 22.7 %  COGNITION: Overall cognitive status: Within functional limits for tasks assessed     SENSATION: WFL  POSTURE: Rounded  shoulders, forward head and trunk lean  UPPER EXTREMITY ROM: Pain with all motions  A/P ROM Right eval Left eval  Shoulder flexion 160/172   Shoulder extension    Shoulder abduction 132/155   Shoulder adduction    Shoulder internal rotation 75/85   Shoulder external rotation 70/86   Elbow flexion full   Elbow extension full   (Blank rows = not tested) Functional IR R thumb to L2, L thumb to T7  UPPER EXTREMITY MMT: * pain; ** most pain  MMT Right eval Left eval  Shoulder flexion 4+*   Shoulder extension 5*   Shoulder abduction 5*   Shoulder adduction    Shoulder internal rotation 4+**   Shoulder external rotation 4-*   Middle trapezius    Lower trapezius    Elbow flexion 4+   Elbow extension 5   (Blank rows = not tested)  SHOULDER SPECIAL TESTS: Impingement tests: Neer impingement test: positive  and Hawkins/Kennedy impingement test: positive  SLAP lesions: Clunk test: negative Instability tests: Apprehension test: negative Rotator cuff assessment: Empty can test: positive , Full can test: negative, and Gerber lift off test: negative   JOINT MOBILITY TESTING:  WNL  PALPATION:  R ant cuff, IS and subscap, tight UT   TODAY'S TREATMENT:                                                                                                                                         DATE:  07/27/23  See pt ed and HEP   PATIENT  EDUCATION: Education details: PT eval findings, anticipated POC, initial HEP, postural awareness, and role of DN  Person educated: Patient Education method: Explanation, Demonstration, and Handouts Education comprehension: verbalized understanding and returned demonstration  HOME EXERCISE PROGRAM: Access Code: [email or web address] URL: [email or web address] Date: 07/27/2023 Prepared by: Raynelle Fanning  Exercises - Standing Shoulder External Rotation Stretch in Doorway (Mirrored)  - 2 x daily - 7 x weekly - 1 sets - 3 reps - 15-30 sec hold - Shoulder  Internal Rotation with Resistance  - 1 x daily - 4 x weekly - 1 sets - 10 reps - Standing Shoulder Row with Anchored Resistance  - 1 x daily - 4 x weekly - 1-3 sets - 10 reps - Prone Single Arm Shoulder Horizontal Abduction with Scapular Retraction and Palm Down  - 1 x daily - 7 x weekly - 1-3 sets - 10 reps  ASSESSMENT:  CLINICAL IMPRESSION: Patient is a 72 y.o. male who was seen today for physical therapy evaluation and treatment for chronic R shoulder pain. He has pain primarily with activities at 90 degrees and above affecting his ability to perform home maintenance and renovations to his rental houses and home in addition to normal [facility] ADLs. He has limitations in R shoulder ROM and strength and demonstrates anterior GH joint instability with AROM. Special tests to not indicate a clear diagnosis. He has gotten relief with injections over the years, but this latest one has not lasted as long as previous ones. He will benefit from skilled PT to address these deficits.  PT recommended 2x/wk for 6 wks, but patient only wanted 1x/wk.   OBJECTIVE IMPAIRMENTS: decreased activity tolerance, decreased ROM, decreased strength, increased muscle spasms, impaired flexibility, impaired UE functional use, postural dysfunction, and pain.   ACTIVITY LIMITATIONS: lifting, sleeping, reach over head, and hygiene/grooming  PARTICIPATION LIMITATIONS: occupation  PERSONAL FACTORS: Age and Time since onset of injury/illness/exacerbation are also affecting patient's functional outcome.   REHAB POTENTIAL: Good  CLINICAL DECISION MAKING: Evolving/moderate complexity  EVALUATION COMPLEXITY: Moderate   GOALS: Goals reviewed with patient? Yes  SHORT TERM GOALS: Target date: 08/17/2023   Patient will be independent with initial HEP.  Baseline:  Goal status: INITIAL  2.  Able to sleep without waking from pain.  Baseline:  Goal status: INITIAL   LONG TERM GOALS: Target date: 09/07/2023   Patient will be  independent with advanced/ongoing HEP to improve outcomes and carryover.  Baseline:  Goal status: INITIAL  2.  Patient to improve R shoulder ROM to [facility] with 75% less pain to allow for increased ease of ADLs.  Baseline:  Goal status: INITIAL  3.  Patient will demonstrate improved UE strength to 5/5.  Baseline:  Goal status: INITIAL  4.  Patient will report 12 on Quick Dash to demonstrate improved functional ability.  Baseline:  Goal status: INITIAL   PLAN:  PT FREQUENCY: 1-2x/week  PT DURATION: 6 weeks  PLANNED INTERVENTIONS: 97164- PT Re-evaluation, 97110-Therapeutic exercises, 97530- Therapeutic activity, 97112- Neuromuscular re-education, 97535- Self Care, 28413- Manual therapy, 97014- Electrical stimulation (unattended), [phone]- Ionotophoresis 4mg /ml Dexamethasone, Patient/Family education, Taping, Dry Needling, Joint mobilization, Spinal mobilization, Cryotherapy, and Moist heat  PLAN FOR NEXT SESSION: DN to R UT, subscap, infraspinatus, pecs; shoulder and postural strengthening, possible ionto anterior shoulder, progress HEP   Solon Palm, PT  07/27/2023, 9:32 AM   Date of referral: 07/07/23 Referring provider: Burna Forts, MD  Referring diagnosis? M25.511, G89.29 Treatment diagnosis? (if different than referring diagnosis) same  What was this (referring  dx) caused by? Ongoing Issue  Ashby Dawes of Condition: Chronic (continuous duration > 3 months)   Laterality: Rt  Current Functional Measure Score: Other Quick Dash 22.7 / 100 = 22.7 %  Objective measurements identify impairments when they are compared to normal values, the uninvolved extremity, and prior level of function.  [x]  Yes  []  No  Objective assessment of functional ability: Moderate functional limitations   Briefly describe symptoms: chronic pain in the  R shoulder with activities primarily above 90 degrees.  How did symptoms start: unknown MOI, began 3 years ago  Average pain intensity:  Last 24  hours: 3-4  Past week: 3-4  How often does the pt experience symptoms? Frequently  How much have the symptoms interfered with usual daily activities? Extremely  How has condition changed since care began at this facility? NA - initial visit  In general, how is the patients overall health? Excellent   BACK PAIN (STarT Back Screening Tool) No

## 2023-07-27 ENCOUNTER — Ambulatory Visit: Payer: Medicare Other | Attending: Family Medicine | Admitting: Physical Therapy

## 2023-07-27 ENCOUNTER — Encounter: Payer: Self-pay | Admitting: Physical Therapy

## 2023-07-27 ENCOUNTER — Other Ambulatory Visit: Payer: Self-pay

## 2023-07-27 DIAGNOSIS — G8929 Other chronic pain: Secondary | ICD-10-CM | POA: Insufficient documentation

## 2023-07-27 DIAGNOSIS — M6281 Muscle weakness (generalized): Secondary | ICD-10-CM | POA: Insufficient documentation

## 2023-07-27 DIAGNOSIS — M25511 Pain in right shoulder: Secondary | ICD-10-CM | POA: Diagnosis not present

## 2023-07-27 DIAGNOSIS — R252 Cramp and spasm: Secondary | ICD-10-CM | POA: Insufficient documentation

## 2023-07-28 ENCOUNTER — Ambulatory Visit (INDEPENDENT_AMBULATORY_CARE_PROVIDER_SITE_OTHER): Payer: Medicare Other | Admitting: Medical

## 2023-07-28 VITALS — BP 122/82 | HR 66 | Resp 18 | Ht 73.0 in | Wt 263.0 lb

## 2023-07-28 DIAGNOSIS — R9431 Abnormal electrocardiogram [ECG] [EKG]: Secondary | ICD-10-CM

## 2023-07-28 DIAGNOSIS — R350 Frequency of micturition: Secondary | ICD-10-CM | POA: Diagnosis not present

## 2023-07-28 DIAGNOSIS — R002 Palpitations: Secondary | ICD-10-CM

## 2023-07-28 DIAGNOSIS — E039 Hypothyroidism, unspecified: Secondary | ICD-10-CM

## 2023-07-28 DIAGNOSIS — I10 Essential (primary) hypertension: Secondary | ICD-10-CM | POA: Diagnosis not present

## 2023-07-28 DIAGNOSIS — E785 Hyperlipidemia, unspecified: Secondary | ICD-10-CM | POA: Diagnosis not present

## 2023-07-28 DIAGNOSIS — Z0000 Encounter for general adult medical examination without abnormal findings: Secondary | ICD-10-CM

## 2023-07-28 LAB — COMPREHENSIVE METABOLIC PANEL
ALT: 17 U/L (ref 0–53)
AST: 17 U/L (ref 0–37)
Albumin: 4.2 g/dL (ref 3.5–5.2)
Alkaline Phosphatase: 48 U/L (ref 39–117)
BUN: 20 mg/dL (ref 6–23)
CO2: 28 meq/L (ref 19–32)
Calcium: 9.3 mg/dL (ref 8.4–10.5)
Chloride: 102 meq/L (ref 96–112)
Creatinine, Ser: 1.08 mg/dL (ref 0.40–1.50)
GFR: 69.61 mL/min (ref >60.00)
Glucose, Bld: 85 mg/dL (ref 70–99)
Potassium: 4.5 meq/L (ref 3.5–5.1)
Sodium: 138 meq/L (ref 135–145)
Total Bilirubin: 1 mg/dL (ref 0.2–1.2)
Total Protein: 6.9 g/dL (ref 6.0–8.3)

## 2023-07-28 LAB — LIPID PANEL
Cholesterol: 197 mg/dL (ref 0–200)
HDL: 35.1 mg/dL — ABNORMAL LOW (ref >39.00)
LDL Cholesterol: 143 mg/dL — ABNORMAL HIGH (ref 0–99)
NonHDL: 162.3
Total CHOL/HDL Ratio: 6
Triglycerides: 97 mg/dL (ref 0.0–149.0)
VLDL: 19.4 mg/dL (ref 0.0–40.0)

## 2023-07-28 LAB — CBC WITH DIFFERENTIAL/PLATELET
Basophils Absolute: 0 10*3/uL (ref 0.0–0.1)
Basophils Relative: 0.5 % (ref 0.0–3.0)
Eosinophils Absolute: 0.1 10*3/uL (ref 0.0–0.7)
Eosinophils Relative: 1.7 % (ref 0.0–5.0)
HCT: 48 % (ref 39.0–52.0)
Hemoglobin: 16.8 g/dL (ref 13.0–17.0)
Lymphocytes Relative: 25.8 % (ref 12.0–46.0)
Lymphs Abs: 1.4 10*3/uL (ref 0.7–4.0)
MCHC: 34.9 g/dL (ref 30.0–36.0)
MCV: 100.5 fL — ABNORMAL HIGH (ref 78.0–100.0)
Monocytes Absolute: 0.3 10*3/uL (ref 0.1–1.0)
Monocytes Relative: 6 % (ref 3.0–12.0)
Neutro Abs: 3.7 10*3/uL (ref 1.4–7.7)
Neutrophils Relative %: 66 % (ref 43.0–77.0)
Platelets: 229 10*3/uL (ref 150.0–400.0)
RBC: 4.78 Mil/uL (ref 4.22–5.81)
RDW: 12.7 % (ref 11.5–15.5)
WBC: 5.6 10*3/uL (ref 4.0–10.5)

## 2023-07-28 LAB — T4, FREE: Free T4: 1.29 ng/dL (ref 0.60–1.60)

## 2023-07-28 LAB — PSA: PSA: 0.53 ng/mL (ref 0.10–4.00)

## 2023-07-28 LAB — TSH: TSH: 3.03 u[IU]/mL (ref 0.35–5.50)

## 2023-07-28 NOTE — Patient Instructions (Addendum)
For you wellness exam today I have ordered cbc, cmp and lipid panel.  Vaccine declined  Recommend exercise and healthy diet.  We will let you know lab results as they come in.  Follow up date appointment will be determined after lab review.     Awareness of heart beat. Pounding event transient. Not clear if may have been palpitation vs atypical chest pain. -referral to cardiologist. - EKG 12-Lead. Sinus rtyhm. 1st degree av block. Rate 60.  Hypertension, unspecified type Continue aceon. Bp well controlled. 122/82  - CBC w/Diff - Comp Met (CMET)  Hypothyroidism, unspecified type -continue levothyroxine - TSH - T4, free  Hyperlipidemia, unspecified hyperlipidemia type -reheck level today - Lipid panel  Frequent urination. Possible associated with hydrating. But at your age decided to get psa. - PSA  Abdomen pain rlq-chronic daily low-level pain for years with transient sharp pain a weeks ago but those features are not present.  Also no current nausea, vomiting, fever, chills or sweats.  Known hernia in this area on discussion today.  I do not think presently CT scan is indicated.  Will follow your infection fighting cells.  If you have recurrent worst pain above your baseline low level chronic pain let me know.  In that event would order imaging studies.  Might need you to be seen in office again prior to scheduling outpatient CT as those typically require prior authorization.  In the event of any acute severe pain then recommend emergency department evaluation  Preventive Care 65 Years and Older, Male Preventive care refers to lifestyle choices and visits with your health care provider that can promote health and wellness. Preventive care visits are also called wellness exams. What can I expect for my preventive care visit? Counseling During your preventive care visit, your health care provider may ask about your: Medical history, including: Past medical problems. Family  medical history. History of falls. Current health, including: Emotional well-being. Home life and relationship well-being. Sexual activity. Memory and ability to understand (cognition). Lifestyle, including: Alcohol, nicotine or tobacco, and drug use. Access to firearms. Diet, exercise, and sleep habits. Work and work Astronomer. Sunscreen use. Safety issues such as seatbelt and bike helmet use. Physical exam Your health care provider will check your: Height and weight. These may be used to calculate your BMI (body mass index). BMI is a measurement that tells if you are at a healthy weight. Waist circumference. This measures the distance around your waistline. This measurement also tells if you are at a healthy weight and may help predict your risk of certain diseases, such as type 2 diabetes and high blood pressure. Heart rate and blood pressure. Body temperature. Skin for abnormal spots. What immunizations do I need?  Vaccines are usually given at various ages, according to a schedule. Your health care provider will recommend vaccines for you based on your age, medical history, and lifestyle or other factors, such as travel or where you work. What tests do I need? Screening Your health care provider may recommend screening tests for certain conditions. This may include: Lipid and cholesterol levels. Diabetes screening. This is done by checking your blood sugar (glucose) after you have not eaten for a while (fasting). Hepatitis C test. Hepatitis B test. HIV (human immunodeficiency virus) test. STI (sexually transmitted infection) testing, if you are at risk. Lung cancer screening. Colorectal cancer screening. Prostate cancer screening. Abdominal aortic aneurysm (AAA) screening. You may need this if you are a current or former smoker. Talk with your  health care provider about your test results, treatment options, and if necessary, the need for more tests. Follow these  instructions at home: Eating and drinking  Eat a diet that includes fresh fruits and vegetables, whole grains, lean protein, and low-fat dairy products. Limit your intake of foods with high amounts of sugar, saturated fats, and salt. Take vitamin and mineral supplements as recommended by your health care provider. Do not drink alcohol if your health care provider tells you not to drink. If you drink alcohol: Limit how much you have to 0-2 drinks a day. Know how much alcohol is in your drink. In the [place] one drink equals one 12 oz bottle of beer (355 mL), one 5 oz glass of wine (148 mL), or one 1 oz glass of hard liquor (44 mL). Lifestyle Brush your teeth every morning and night with fluoride toothpaste. Floss one time each day. Exercise for at least 30 minutes 5 or more days each week. Do not use any products that contain nicotine or tobacco. These products include cigarettes, chewing tobacco, and vaping devices, such as e-cigarettes. If you need help quitting, ask your health care provider. Do not use drugs. If you are sexually active, practice safe sex. Use a condom or other form of protection to prevent STIs. Take aspirin only as told by your health care provider. Make sure that you understand how much to take and what form to take. Work with your health care provider to find out whether it is safe and beneficial for you to take aspirin daily. Ask your health care provider if you need to take a cholesterol-lowering medicine (statin). Find healthy ways to manage stress, such as: Meditation, yoga, or listening to music. Journaling. Talking to a trusted person. Spending time with friends and family. Safety Always wear your seat belt while driving or riding in a vehicle. Do not drive: If you have been drinking alcohol. Do not ride with someone who has been drinking. When you are tired or distracted. While texting. If you have been using any mind-altering substances or drugs. Wear a  helmet and other protective equipment during sports activities. If you have firearms in your house, make sure you follow all gun safety procedures. Minimize exposure to UV radiation to reduce your risk of skin cancer. What's next? Visit your health care provider once a year for an annual wellness visit. Ask your health care provider how often you should have your eyes and teeth checked. Stay up to date on all vaccines. This information is not intended to replace advice given to you by your health care provider. Make sure you discuss any questions you have with your health care provider. Document Revised: 01/28/2021 Document Reviewed: 01/28/2021 Elsevier Patient Education  2024 [employer].

## 2023-07-28 NOTE — Progress Notes (Signed)
Subjective:    Patient ID: Timothy Anthony, male    DOB: 09-Mar-1951, 72 y.o.   MRN: [id number]  HPI Pt tells me that he has medicare advantate and he has gotten Wellness exams that are covered under his insurance. He is here for physical. So will use wellness exam dx.  Overall eating moderate. Non smoker. Does not exercise but states active. Alcohol on weekends 1-2 beers. Some during week occaional 1-2 beers as well.   Pt up to date on colonoscopy.  Pt declines all vaccines.  Pt also mentions on Sunday night after eating pizza roles felt heart pounding/strong heart beat. He denies pain at that time. His pulse rate at that time was 82. Strong pounding sensation lasted 2 hours. His bp at that time was 140/85.  Years ago had similar event and he followed up with his pcp at that time. He states it was attributed to high salt diet. He had EKG back then, stress test and carotid dopplers. All studies were normal at that time. No recurrent symptoms until this Sunday.  Pt suggest maybe he needs to see cardiologist for the above. No family hx MI. Pt brother does have a fib.  Htn- pt is on Aceon.4 mg daily.  Frequent urination. Get up 2-4 times a night on average.  Hyperlipidemia in past. But recent level have been normal  Hypothyoird- on 150 mcg daily.  3 weeks ago brief 5-6 seconds ago then symptoms resolved. Pt had appendix removed in past nad complications. Various surgeries in past. Had mesh and hernia.          Review of Systems  Constitutional:  Negative for chills, fatigue and fever.  Respiratory:  Negative for cough, chest tightness, wheezing and stridor.   Cardiovascular:  Negative for chest pain and palpitations.       No current symptoms. See hpi.  Gastrointestinal:  Negative for abdominal pain.       See hpi  Genitourinary:  Positive for frequency. Negative for dysuria.  Musculoskeletal:  Negative for back pain, myalgias and neck stiffness.  Skin:  Negative for rash.   Neurological:  Negative for dizziness, seizures, weakness and light-headedness.  Hematological:  Negative for adenopathy. Does not bruise/bleed easily.  Psychiatric/Behavioral:  Negative for behavioral problems and confusion.    Past Medical History:  Diagnosis Date   Anxiety    Hyperlipidemia    Hypertension    Hypothyroidism      Social History   Socioeconomic History   Marital status: Married    Spouse name: Not on file   Number of children: Not on file   Years of education: Not on file   Highest education level: Not on file  Occupational History   Not on file  Tobacco Use   Smoking status: Former    Current packs/day: 0.00    Average packs/day: 0.5 packs/day for 6.0 years (3.0 ttl pk-yrs)    Types: Cigarettes    Start date: 08/20/1971    Quit date: 08/19/1977    Years since quitting: 45.9   Smokeless tobacco: Not on file  Vaping Use   Vaping status: Never Used  Substance and Sexual Activity   Alcohol use: Yes    Comment: rare in summers.   Drug use: No   Sexual activity: Not on file  Other Topics Concern   Not on file  Social History Narrative   Not on file   Social Drivers of Health   Financial Resource Strain: Low Risk  (  11/22/2022)   Overall Financial Resource Strain (CARDIA)    Difficulty of Paying Living Expenses: Not hard at all  Food Insecurity: No Food Insecurity (11/22/2022)   Hunger Vital Sign    Worried About Running Out of Food in the Last Year: Never true    Ran Out of Food in the Last Year: Never true  Transportation Needs: No Transportation Needs (11/22/2022)   PRAPARE - Administrator, Civil Service (Medical): No    Lack of Transportation (Non-Medical): No  Physical Activity: Inactive (11/22/2022)   Exercise Vital Sign    Days of Exercise per Week: 0 days    Minutes of Exercise per Session: 0 min  Stress: No Stress Concern Present (11/22/2022)   [employer] of Occupational Health - Occupational Stress Questionnaire    Feeling of  Stress : Not at all  Social Connections: Moderately Isolated (11/22/2022)   Social Connection and Isolation Panel [NHANES]    Frequency of Communication with Friends and Family: Twice a week    Frequency of Social Gatherings with Friends and Family: Once a week    Attends Religious Services: Never    Database administrator or Organizations: No    Attends Banker Meetings: Never    Marital Status: Married  Catering manager Violence: Not At Risk (11/22/2022)   Humiliation, Afraid, Rape, and Kick questionnaire    Fear of Current or Ex-Partner: No    Emotionally Abused: No    Physically Abused: No    Sexually Abused: No    Past Surgical History:  Procedure Laterality Date   APPENDECTOMY  1993   calcium deposits removal Bilateral 09/2006 and 09/2015   off the eyelids   HERNIA REPAIR  1995,2003,2005,2006,2011   hernia repairs after each surgery    Family History  Problem Relation Age of Onset   Allergic rhinitis Neg Hx    Angioedema Neg Hx    Asthma Neg Hx    Eczema Neg Hx    Immunodeficiency Neg Hx    Urticaria Neg Hx     No Known Allergies  Current Outpatient Medications on File Prior to Visit  Medication Sig Dispense Refill   levothyroxine (SYNTHROID) 150 MCG tablet Take 150 mcg by mouth daily before breakfast.     LORazepam (ATIVAN) 0.5 MG tablet Take 1 tablet (0.5 mg total) by mouth every 8 (eight) hours. (Patient not taking: Reported on 07/27/2023) 30 tablet 0   perindopril (ACEON) 4 MG tablet Take 1 tablet (4 mg total) by mouth daily. 90 tablet 3   No current facility-administered medications on file prior to visit.    BP 122/82   Pulse 66   Resp 18   Ht 6\' 1"  (1.854 m)   Wt 263 lb (119.3 kg)   SpO2 96%   BMI 34.70 kg/m        Objective:   Physical Exam  General Mental Status- Alert. General Appearance- Not in acute distress.   Skin General: Color- Normal Color. Moisture- Normal Moisture.  Neck Carotid Arteries- Normal color. Moisture-  Normal Moisture. No carotid bruits. No JVD.  Chest and Lung Exam Auscultation: Breath Sounds:-Normal.  Cardiovascular Auscultation:Rythm- Regular. Murmurs & Other Heart Sounds:Auscultation of the heart reveals- No Murmurs.  Abdomen Inspection:-Inspeection Normal. Palpation/Percussion:Note:No mass. Palpation and Percussion of the abdomen reveal- Non Tender prentlly, Non Distended + BS, no rebound or guarding.    Neurologic Cranial Nerve exam:- CN III-XII intact(No nystagmus), symmetric smile. Strength:- 5/5 equal and symmetric strength both upper  and lower extremities.   Back- no cva tenderness.     Assessment & Plan:   Patient Instructions  For you wellness exam today I have ordered cbc, cmp and lipid panel.  Vaccine declined  Recommend exercise and healthy diet.  We will let you know lab results as they come in.  Follow up date appointment will be determined after lab review.     Awareness of heart beat. Pounding event transient. Not clear if may have been palpitation vs atypical chest pain. -referral to cardiologist. - EKG 12-Lead. Sinus rtyhm. 1st degree av block. Rate 60.  Hypertension, unspecified type Continue aceon. Bp well controlled. 122/82  - CBC w/Diff - Comp Met (CMET)  Hypothyroidism, unspecified type -continue levothyroxine - TSH - T4, free  Hyperlipidemia, unspecified hyperlipidemia type -reheck level today - Lipid panel  Frequent urination. Possible associated with hydrating. But at your age decided to get psa. - PSA  Abdomen pain rlq-chronic daily low-level pain for years with transient sharp pain a weeks ago but those features are not present.  Also no current nausea, vomiting, fever, chills or sweats.  Known hernia in this area on discussion today.  I do not think presently CT scan is indicated.  Will follow your infection fighting cells.  If you have recurrent worst pain above your baseline low level chronic pain let me know.  In that event  would order imaging studies.  Might need you to be seen in office again prior to scheduling outpatient CT as those typically require prior authorization.  In the event of any acute severe pain then recommend emergency department evaluation   [phone] charge in addition to wellness exam. As addressed varius dx including his awarness of heart beat dx, abnromal EKG and discussed his abdomen pain history.

## 2023-08-01 ENCOUNTER — Telehealth: Payer: Self-pay | Admitting: Medical

## 2023-08-01 ENCOUNTER — Encounter: Payer: Self-pay | Admitting: Medical

## 2023-08-01 NOTE — Telephone Encounter (Signed)
Labs not reviewed by PCP yet.

## 2023-08-01 NOTE — Telephone Encounter (Signed)
Patient called and asked for a nurse to call him to discuss his lab results. He stated that he noticed some abnormal readings. Please call and advise.

## 2023-08-03 ENCOUNTER — Ambulatory Visit: Payer: Medicare Other

## 2023-08-03 DIAGNOSIS — R252 Cramp and spasm: Secondary | ICD-10-CM

## 2023-08-03 DIAGNOSIS — G8929 Other chronic pain: Secondary | ICD-10-CM

## 2023-08-03 DIAGNOSIS — M6281 Muscle weakness (generalized): Secondary | ICD-10-CM

## 2023-08-03 NOTE — Therapy (Signed)
[facility] at [facility] [address]  [address] [place], [place], [address] Phone: [phone]   Fax:  [phone]  Patient Details  Name: Timothy Anthony MRN: [id number] Date of Birth: [birth year] Referring Provider:  Burna Forts, MD  Encounter Date: 08/03/2023  Patient arrived for his appointment today. He had reports of increased pain in his R shoulder since doing his HEP. I offered to review his HEP to ensure he wasn't overdoing his exercises and offered to do interventions, however patient was very adamant about therapy not being worthwhile and making his pain worse. He chose to cancel all of his PT visits, he will f/u with Dr. Cherre Robins to discuss further treatment.   Darleene Cleaver, PTA 08/03/2023, 8:23 AM  [facility] at [facility] [address]  [address] [place], [place], [address] Phone: [phone]   Fax:  [phone]

## 2023-08-08 ENCOUNTER — Encounter: Payer: Medicare Other | Admitting: Physical Therapy

## 2023-08-09 ENCOUNTER — Telehealth: Payer: Self-pay

## 2023-08-09 ENCOUNTER — Telehealth: Payer: Medicare Other | Admitting: Medical

## 2023-08-09 VITALS — BP 119/70 | HR 87 | Temp 98.0°F

## 2023-08-09 DIAGNOSIS — R059 Cough, unspecified: Secondary | ICD-10-CM | POA: Diagnosis not present

## 2023-08-09 DIAGNOSIS — U071 COVID-19: Secondary | ICD-10-CM | POA: Diagnosis not present

## 2023-08-09 MED ORDER — BENZONATATE 100 MG PO CAPS: 100.0000 mg | ORAL_CAPSULE | Freq: Three times a day (TID) | ORAL | 0 refills | Status: DC | PRN

## 2023-08-09 MED ORDER — NIRMATRELVIR/RITONAVIR (PAXLOVID)TABLET: 3.0000 | ORAL_TABLET | Freq: Two times a day (BID) | ORAL | 0 refills | Status: AC

## 2023-08-09 MED ORDER — FLUTICASONE PROPIONATE 50 MCG/ACT NA SUSP: 2.0000 | Freq: Every day | NASAL | 1 refills | Status: DC

## 2023-08-09 NOTE — Progress Notes (Signed)
   Subjective:    Patient ID: Timothy Anthony, male    DOB: 1950/12/09, 72 y.o.   MRN: [id number]  HPI  Virtual Visit via Video Note  I connected with Timothy Anthony on 08/09/23 at 11:40 AM EST by a video enabled telemedicine application and verified that I am speaking with the correct person using two identifiers.  Location: Patient: home Provider: office   I discussed the limitations of evaluation and management by telemedicine and the availability of in person appointments. The patient expressed understanding and agreed to proceed.  History of Present Illness:  Discussed the use of AI scribe software for clinical note transcription with the patient, who gave verbal consent to proceed.  History of Present Illness   The patient, a resident of [place], tested positive for COVID-19 using an at-home kit after developing symptoms. This diagnosis comes after his wife was diagnosed with COVID-19 the previous day. The patient's symptoms include a dry, scratchy throat, sinus congestion, a mild headache, and body aches. He also reports experiencing chills but denies any sweating or unusual fatigue. He has been able to sleep well through the night. The patient denies any abnormal leg pain, shortness of breath, or wheezing.  The patient has a history of COVID-19 vaccination, having received two doses in 2020. He also had a previous mild case of COVID-19, which he describes as being similar to a sinus infection. He did not require antibiotics for a secondary infection following his previous COVID-19 illness.        Observations/Objective: General-no acute distress, pleasant, oriented. Lungs- on inspection lungs appear unlabored. Neck- no tracheal deviation or jvd on inspection. Neuro- gross motor function appears intact.  Assessment and Plan:  Assessment and Plan    COVID-19 Positive home test today(day 1 of symptoms with  sore throat, sinus congestion, mild headache, body aches, and  chills. No shortness of breath or wheezing. History of mild COVID-19 infection in 2020. Vaccinated with two doses in 2020. -Prescribe Paxlovid as within the first five days of symptom onset. Start today. -Prescribe Benzonatate for cough -Prescribe Flonase for nasal congestion. -Advise Tylenol for body aches, with the addition of Ibuprofen 200-400mg  if needed. -Advise patient to quarantine for five days and wear a mask for an additional five days. -Request patient update in one week. -Advise patient to contact office if symptoms worsen or if there is development of shortness of breath, severe sinus pressure, or chest congestion.       Follow Up Instructions:    I discussed the assessment and treatment plan with the patient. The patient was provided an opportunity to ask questions and all were answered. The patient agreed with the plan and demonstrated an understanding of the instructions.   The patient was advised to call back or seek an in-person evaluation if the symptoms worsen or if the condition fails to improve as anticipated.     Esperanza Richters, PA-C    Review of Systems     Objective:   Physical Exam        Assessment & Plan:

## 2023-08-09 NOTE — Telephone Encounter (Signed)
Initial Comment Caller states he has a stuffed nose, sore throat, body is aching and he has a headache. Translation No Nurse Assessment Nurse: Elesa Hacker, RN, Nash Dimmer Date/Time (Eastern Time): 08/09/2023 7:08:58 AM Confirm and document reason for call. If symptomatic, describe symptoms. ---Caller states that his wife tested positive for Covid yesterday. Advised that he started having sx last night. Tested positive this am. No temp. Does the patient have any new or worsening symptoms? ---Yes Will a triage be completed? ---Yes Related visit to physician within the last 2 weeks? ---No Does the PT have any chronic conditions? (i.e. diabetes, asthma, this includes High risk factors for pregnancy, etc.) ---Yes List chronic conditions. ---HTN hypothyroid Is this a behavioral health or substance abuse call? ---No Guidelines Guideline Title Affirmed Question Affirmed Notes Nurse Date/Time (Eastern Time) COVID-19 - Diagnosed or Suspected [1] HIGH RISK patient (e.g., weak immune system, age > 64 years, obesity with BMI 30 or higher, pregnant, chronic lung disease) AND [2] COVID symptoms (e.g., Deaton, RN, Nash Dimmer 08/09/2023 7:10:26 AM PLEASE NOTE: All timestamps contained within this report are represented as [place] Standard Time. CONFIDENTIALTY NOTICE: This fax transmission is intended only for the addressee. It contains information that is legally privileged, confidential or otherwise protected from use or disclosure. If you are not the intended recipient, you are strictly prohibited from reviewing, disclosing, copying using or disseminating any of this information or taking any action in reliance on or regarding this information. If you have received this fax in error, please notify us immediately by telephone so that we can arrange for its return to [place]. Phone: [phone], Toll-Free: [phone], Fax: [phone] Page: 2 of 2 Call Id: [id number] Guidelines Guideline Title Affirmed  Question Affirmed Notes Nurse Date/Time Lamount Cohen Time) cough, fever) (Exceptions: Already seen by PCP and no new or worsening symptoms.) Disp. Time Lamount Cohen Time) Disposition Final User 08/09/2023 7:16:42 AM Call PCP within 24 Hours Yes Deaton, RN, Nash Dimmer Final Disposition 08/09/2023 7:16:42 AM Call PCP within 24 Hours Yes Deaton, RN, Cory Roughen Disagree/Comply Comply Caller Understands Yes PreDisposition Did not know what to do Care Advice Given Per Guideline CALL PCP WITHIN 24 HOURS: * You need to discuss this with your doctor (or NP/PA) within the next 24 hours. * IF OFFICE WILL BE OPEN: Call the office when it opens tomorrow morning. GENERAL CARE ADVICE FOR COVID-19 SYMPTOMS: * The symptoms are generally treated the same whether you have COVID-19, influenza or some other respiratory virus. * Cough: Use cough drops. * Feeling dehydrated: Drink extra liquids. If the air in your home is dry, use a humidifier. * Fever, Chills, and Sweats: For fever over 101 F (38.3 C), take acetaminophen every 4 to 6 hours (Adults 650 mg) OR ibuprofen every 6 to 8 hours (Adults 400 mg). Before taking any medicine, read all the instructions on the package. Do not take aspirin unless your doctor has prescribed it for you. Chills can sometimes come before a fever. You may feel cold in your hands and feet. You may have shivering. You may also feel sweaty as your body temperature goes down. * Loss of taste or smell: You may have decreased or a loss of taste or smell. This is a symptom that can happen with COVID, or sometimes with other viruses. There is nothing extra you need to do for this. * Muscle aches, headache, and other pains: Often this comes and goes with the fever. Take acetaminophen every 4 to 6 hours (Adults 650 mg) OR ibuprofen every 6 to  8 hours (Adults 400 mg). Before taking any medicine, read all the instructions on the package. * Do not try to completely stop coughs that produce mucus and  phlegm. COUGH SYRUP WITH DEXTROMETHORPHAN - EXTRA NOTES AND WARNINGS: * Coughing is helpful. It brings up the mucus from the lungs and helps prevent pneumonia. CALL BACK IF: * You become worse CARE ADVICE given per COVID-19 - DIAGNOSED OR SUSPECTED (Adult) guideline. Comments User: Wandra Scot, RN Date/Time Lamount Cohen Time): 08/09/2023 7:18:19 AM Caller will try the office at 8am, if they are not open will do a virtual visit on My chart. Referrals REFERRED TO PCP OFFICE [facility]

## 2023-08-09 NOTE — Telephone Encounter (Signed)
Pt scheduled for VV.

## 2023-08-09 NOTE — Patient Instructions (Signed)
COVID-19 Positive home test today(day 1 of symptoms with  sore throat, sinus congestion, mild headache, body aches, and chills. No shortness of breath or wheezing. History of mild COVID-19 infection in 2020. Vaccinated with two doses in 2020. -Prescribe Paxlovid as within the first five days of symptom onset. Start today. -Prescribe Benzonatate for cough -Prescribe Flonase for nasal congestion. -Advise Tylenol for body aches, with the addition of Ibuprofen 200-400mg  if needed. -Advise patient to quarantine for five days and wear a mask for an additional five days. -Request patient update in one week. -Advise patient to contact office if symptoms worsen or if there is development of shortness of breath, severe sinus pressure, or chest congestion.

## 2023-08-15 ENCOUNTER — Ambulatory Visit: Payer: Medicare Other | Admitting: Family Medicine

## 2023-08-24 ENCOUNTER — Ambulatory Visit: Payer: Medicare Other | Admitting: Medical

## 2023-08-24 ENCOUNTER — Encounter: Payer: Medicare Other | Admitting: Physical Therapy

## 2023-08-24 ENCOUNTER — Encounter: Payer: Self-pay | Admitting: Medical

## 2023-08-24 VITALS — BP 128/80 | HR 68 | Temp 98.0°F | Resp 18 | Ht 73.0 in | Wt 262.0 lb

## 2023-08-24 DIAGNOSIS — Z8616 Personal history of COVID-19: Secondary | ICD-10-CM | POA: Diagnosis not present

## 2023-08-24 DIAGNOSIS — K439 Ventral hernia without obstruction or gangrene: Secondary | ICD-10-CM

## 2023-08-24 DIAGNOSIS — R350 Frequency of micturition: Secondary | ICD-10-CM

## 2023-08-24 DIAGNOSIS — R140 Abdominal distension (gaseous): Secondary | ICD-10-CM | POA: Diagnosis not present

## 2023-08-24 DIAGNOSIS — R1031 Right lower quadrant pain: Secondary | ICD-10-CM

## 2023-08-24 LAB — COMPREHENSIVE METABOLIC PANEL
ALT: 22 U/L (ref 0–53)
AST: 18 U/L (ref 0–37)
Albumin: 4.4 g/dL (ref 3.5–5.2)
Alkaline Phosphatase: 52 U/L (ref 39–117)
BUN: 12 mg/dL (ref 6–23)
CO2: 31 meq/L (ref 19–32)
Calcium: 9.4 mg/dL (ref 8.4–10.5)
Chloride: 101 meq/L (ref 96–112)
Creatinine, Ser: 0.97 mg/dL (ref 0.40–1.50)
GFR: 79.14 mL/min (ref >60.00)
Glucose, Bld: 91 mg/dL (ref 70–99)
Potassium: 4.2 meq/L (ref 3.5–5.1)
Sodium: 137 meq/L (ref 135–145)
Total Bilirubin: 0.8 mg/dL (ref 0.2–1.2)
Total Protein: 7 g/dL (ref 6.0–8.3)

## 2023-08-24 LAB — CBC WITH DIFFERENTIAL/PLATELET
Basophils Absolute: 0 10*3/uL (ref 0.0–0.1)
Basophils Relative: 0.4 % (ref 0.0–3.0)
Eosinophils Absolute: 0.2 10*3/uL (ref 0.0–0.7)
Eosinophils Relative: 2.3 % (ref 0.0–5.0)
HCT: 47.8 % (ref 39.0–52.0)
Hemoglobin: 16.7 g/dL (ref 13.0–17.0)
Lymphocytes Relative: 27 % (ref 12.0–46.0)
Lymphs Abs: 1.8 10*3/uL (ref 0.7–4.0)
MCHC: 34.9 g/dL (ref 30.0–36.0)
MCV: 99.5 fL (ref 78.0–100.0)
Monocytes Absolute: 0.4 10*3/uL (ref 0.1–1.0)
Monocytes Relative: 6.2 % (ref 3.0–12.0)
Neutro Abs: 4.3 10*3/uL (ref 1.4–7.7)
Neutrophils Relative %: 64.1 % (ref 43.0–77.0)
Platelets: 262 10*3/uL (ref 150.0–400.0)
RBC: 4.81 Mil/uL (ref 4.22–5.81)
RDW: 12.3 % (ref 11.5–15.5)
WBC: 6.8 10*3/uL (ref 4.0–10.5)

## 2023-08-24 LAB — LIPASE: Lipase: 32 U/L (ref 11.0–59.0)

## 2023-08-24 NOTE — Patient Instructions (Signed)
 COVID-19(hx of now resolved clinically with no secondary infections) Recent infection treated with Paxlovid . Reported significant side effects including nausea and altered taste to med.. Currently asymptomatic with no residual symptoms. -No further treatment required.  Frequent Urination Chronic issue, possibly exacerbated by high fluid intake. No other urinary symptoms reported. -Refer to Urology for further evaluation.  Chronic Abdominal Pain History of multiple abdominal surgeries including appendectomy and hernia repairs with mesh. Recent change in pain pattern with new fluttering sensation. No acute symptoms such as nausea, vomiting, fever, or severe pain. Pain varies but brief mild and if palpates lightly pain often resolves quickly. -Order CBC, metabolic panel, and lipase. -Consult with colleagues regarding potential need for CT abdomen and pelvis with or without contrast. -Consider referral to General Surgery for further evaluation. -Advise patient to seek emergency care if pain becomes severe and constant.  Follow up date to be determined after lab review.

## 2023-08-24 NOTE — Progress Notes (Addendum)
 Subjective:    Patient ID: Timothy Anthony, male    DOB: Aug 12, 1951, 73 y.o.   MRN: [id number]  HPI Discussed the use of AI scribe software for clinical note transcription with the patient, who gave verbal consent to proceed.  History of Present Illness   The patient, with a recent history of COVID-19 infection, was prescribed Paxlovid , Benzonatate , and Flonase  nasal spray. He reports a quick recovery from the infection, with symptoms limited to a scratchy throat and stuffy nose. However, he experienced significant adverse effects from the Paxlovid , including severe nausea and a persistent unpleasant taste, which led to decreased appetite. These side effects resolved after discontinuation of the medication.  The patient also reports a longstanding issue of frequent urination, which he attributes to high fluid intake. He expresses concern about not always feeling fully emptied after urination. He has requested a referral to a urologist for further evaluation. Unable to give urine sample today but not due to urinary obstruction.  Additionally, the patient has been experiencing a change in a chronic right lower quadrant abdominal pain. Previously characterized as transient and sharp, the pain has recently manifested as a fluttering sensation, occurring mostly at night. The patient reports a history of multiple abdominal surgeries, including an appendectomy and subsequent hernia repairs with mesh placement. The patient has not experienced any associated nausea, vomiting, fever, chills, or changes in bowel movements. He has noticed a bulge in the area, raising concerns about possible mesh failure or adhesion pain. The patient is open to further diagnostic studies and possibly a consultation with a general surgeon.         Abdomen pain hx below on past note.  Abdomen pain rlq-chronic daily low-level pain for years with transient sharp pain a weeks ago but those features are not present.  Also no  current nausea, vomiting, fever, chills or sweats.  Known hernia in this area on discussion today.  I do not think presently CT scan is indicated.  Will follow your infection fighting cells.  If you have recurrent worst pain above your baseline low level chronic pain let me know.  In that event would order imaging studies.  Might need you to be seen in office again prior to scheduling outpatient CT as those typically require prior authorization.  In the event of any acute severe pain then recommend emergency department evaluation   Review of Systems  Constitutional:  Negative for chills, fatigue and fever.  HENT:  Negative for congestion, ear pain, sinus pressure and sinus pain.   Respiratory:  Negative for cough, chest tightness and wheezing.   Cardiovascular:  Negative for chest pain and palpitations.  Gastrointestinal:  Positive for abdominal pain. Negative for blood in stool, diarrhea, nausea and vomiting.       See hpi  Genitourinary:  Positive for frequency. Negative for decreased urine volume, dysuria, penile pain and testicular pain.  Musculoskeletal:  Negative for back pain and myalgias.  Neurological:  Negative for dizziness, weakness, light-headedness and numbness.  Hematological:  Negative for adenopathy.  Psychiatric/Behavioral:  Negative for behavioral problems, decreased concentration and dysphoric mood. The patient is not nervous/anxious.     Past Medical History:  Diagnosis Date   Anxiety    Hyperlipidemia    Hypertension    Hypothyroidism      Social History   Socioeconomic History   Marital status: Married    Spouse name: Not on file   Number of children: Not on file   Years of education:  Not on file   Highest education level: Not on file  Occupational History   Not on file  Tobacco Use   Smoking status: Former    Current packs/day: 0.00    Average packs/day: 0.5 packs/day for 6.0 years (3.0 ttl pk-yrs)    Types: Cigarettes    Start date: 08/20/1971    Quit  date: 08/19/1977    Years since quitting: 46.0   Smokeless tobacco: Not on file  Vaping Use   Vaping status: Never Used  Substance and Sexual Activity   Alcohol use: Yes    Comment: rare in summers.   Drug use: No   Sexual activity: Not on file  Other Topics Concern   Not on file  Social History Narrative   Not on file   Social Drivers of Health   Financial Resource Strain: Low Risk  (11/22/2022)   Overall Financial Resource Strain (CARDIA)    Difficulty of Paying Living Expenses: Not hard at all  Food Insecurity: No Food Insecurity (11/22/2022)   Hunger Vital Sign    Worried About Running Out of Food in the Last Year: Never true    Ran Out of Food in the Last Year: Never true  Transportation Needs: No Transportation Needs (11/22/2022)   PRAPARE - Administrator, Civil Service (Medical): No    Lack of Transportation (Non-Medical): No  Physical Activity: Inactive (11/22/2022)   Exercise Vital Sign    Days of Exercise per Week: 0 days    Minutes of Exercise per Session: 0 min  Stress: No Stress Concern Present (11/22/2022)   [employer] of Occupational Health - Occupational Stress Questionnaire    Feeling of Stress : Not at all  Social Connections: Moderately Isolated (11/22/2022)   Social Connection and Isolation Panel [NHANES]    Frequency of Communication with Friends and Family: Twice a week    Frequency of Social Gatherings with Friends and Family: Once a week    Attends Religious Services: Never    Database Administrator or Organizations: No    Attends Banker Meetings: Never    Marital Status: Married  Catering Manager Violence: Not At Risk (11/22/2022)   Humiliation, Afraid, Rape, and Kick questionnaire    Fear of Current or Ex-Partner: No    Emotionally Abused: No    Physically Abused: No    Sexually Abused: No    Past Surgical History:  Procedure Laterality Date   APPENDECTOMY  1993   calcium  deposits removal Bilateral 09/2006 and 09/2015    off the eyelids   HERNIA REPAIR  1995,2003,2005,2006,2011   hernia repairs after each surgery    Family History  Problem Relation Age of Onset   Allergic rhinitis Neg Hx    Angioedema Neg Hx    Asthma Neg Hx    Eczema Neg Hx    Immunodeficiency Neg Hx    Urticaria Neg Hx     No Known Allergies  Current Outpatient Medications on File Prior to Visit  Medication Sig Dispense Refill   fluticasone  (FLONASE ) 50 MCG/ACT nasal spray Place 2 sprays into both nostrils daily. 16 g 1   levothyroxine  (SYNTHROID ) 150 MCG tablet Take 150 mcg by mouth daily before breakfast.     perindopril  (ACEON ) 4 MG tablet Take 1 tablet (4 mg total) by mouth daily. 90 tablet 3   LORazepam  (ATIVAN ) 0.5 MG tablet Take 1 tablet (0.5 mg total) by mouth every 8 (eight) hours. (Patient not taking: Reported on 08/24/2023)  30 tablet 0   No current facility-administered medications on file prior to visit.    BP 128/80 (BP Location: Left Arm, Patient Position: Sitting, Cuff Size: Large)   Pulse 68   Temp 98 F (36.7 C) (Oral)   Resp 18   Ht 6' 1 (1.854 m)   Wt 262 lb (118.8 kg)   SpO2 93%   BMI 34.57 kg/m        Objective:   Physical Exam  General Mental Status- Alert. General Appearance- Not in acute distress.   Skin General: Color- Normal Color. Moisture- Normal Moisture.  Neck Carotid Arteries- Normal color. Moisture- Normal Moisture. No carotid bruits. No JVD.  Chest and Lung Exam Auscultation: Breath Sounds:-Normal.  Cardiovascular Auscultation:Rythm- Regular. Murmurs & Other Heart Sounds:Auscultation of the heart reveals- No Murmurs.  Abdomen Inspection:-Inspeection Normal. Palpation/Percussion:Note: Palpation and Percussion of the abdomen reveal- slight bulge rt side abdomen/flank. Bulge mild protrudes but about 8 cm x 4 cm. Faint Tender, in this area and toward rt rlq area Non Distended + BS, no rebound or guarding.    Neurologic Cranial Nerve exam:- CN III-XII intact(No  nystagmus), symmetric smile. Drift Test:- No drift. Romberg Exam:- Negative.  Heal to Toe Gait exam:-Normal. Finger to Nose:- Normal/Intact Strength:- 5/5 equal and symmetric strength both upper and lower extremities.       Assessment & Plan:   Patient Instructions  COVID-19(hx of now resolved clinically with no secondary infections) Recent infection treated with Paxlovid . Reported significant side effects including nausea and altered taste to med.. Currently asymptomatic with no residual symptoms. -No further treatment required.  Frequent Urination Chronic issue, possibly exacerbated by high fluid intake. No other urinary symptoms reported. -Refer to Urology for further evaluation.  Chronic Abdominal Pain History of multiple abdominal surgeries including appendectomy and hernia repairs with mesh. Recent change in pain pattern with new fluttering sensation. No acute symptoms such as nausea, vomiting, fever, or severe pain. Pain varies but brief mild and if palpates lightly pain often resolves quickly. -Order CBC, metabolic panel, and lipase. -Consult with colleagues regarding potential need for CT abdomen and pelvis with or without contrast. -Consider referral to General Surgery for further evaluation. -Advise patient to seek emergency care if pain becomes severe and constant.  Follow up date to be determined after lab review.

## 2023-08-25 NOTE — Addendum Note (Signed)
 Addended by: Gwenevere Abbot on: 08/25/2023 05:21 PM   Modules accepted: Orders

## 2023-08-26 ENCOUNTER — Encounter: Payer: Self-pay | Admitting: Medical

## 2023-08-29 ENCOUNTER — Telehealth: Payer: Self-pay

## 2023-08-29 NOTE — Telephone Encounter (Signed)
 Copied from CRM [id number]. Topic: General - Other >> Aug 29, 2023 10:33 AM Burnard DEL wrote: Reason for CRM: patient called in stating that he got a call today in imaging scheduling stating that if he wanted to get lower abdomen and pelvic and upper chest CT scan done ,that the order had to come from Dr Dorina gibson they wanted to get all of these scans done in one visit.

## 2023-09-01 ENCOUNTER — Telehealth: Payer: Self-pay

## 2023-09-01 NOTE — Telephone Encounter (Signed)
Pt called and advised pt to go ahead and get CT SCAN for his hernia problems and follow up with PCP after scan

## 2023-09-01 NOTE — Telephone Encounter (Signed)
Copied from CRM [id number]. Topic: General - Inquiry >> Sep 01, 2023 10:17 AM Hector Shade B wrote: Reason for CRM: Patient called in to check the status of the decision on the scan he called in about on Monday. I advised patient I would send over notification that he's waiting on a call back to the clinical staff Patient contact information is [phone]

## 2023-09-02 NOTE — Telephone Encounter (Signed)
Pt is having CT 09/07/23

## 2023-09-07 ENCOUNTER — Encounter: Payer: Self-pay | Admitting: Medical

## 2023-09-07 ENCOUNTER — Ambulatory Visit (HOSPITAL_BASED_OUTPATIENT_CLINIC_OR_DEPARTMENT_OTHER)
Admission: RE | Admit: 2023-09-07 | Discharge: 2023-09-07 | Disposition: A | Discharge status: Home / Self Care | Payer: Medicare Other | Source: Ambulatory Visit | Attending: Medical | Admitting: Medical

## 2023-09-07 DIAGNOSIS — R140 Abdominal distension (gaseous): Secondary | ICD-10-CM | POA: Insufficient documentation

## 2023-09-07 DIAGNOSIS — K5730 Diverticulosis of large intestine without perforation or abscess without bleeding: Secondary | ICD-10-CM | POA: Diagnosis not present

## 2023-09-07 DIAGNOSIS — K439 Ventral hernia without obstruction or gangrene: Secondary | ICD-10-CM | POA: Insufficient documentation

## 2023-09-07 DIAGNOSIS — R1031 Right lower quadrant pain: Secondary | ICD-10-CM | POA: Insufficient documentation

## 2023-09-07 DIAGNOSIS — N200 Calculus of kidney: Secondary | ICD-10-CM | POA: Diagnosis not present

## 2023-09-07 NOTE — Addendum Note (Signed)
Addended by: Gwenevere Abbot on: 09/07/2023 08:23 PM   Modules accepted: Orders

## 2023-09-08 NOTE — Progress Notes (Signed)
09/09/2023 Timothy Anthony [id number] [birth year]  Referring provider: Esperanza Richters, PA-C Primary GI doctor: Dr. Doy Hutching  ASSESSMENT AND PLAN:  Family history of colon cancer and personal history of colon polyps Father with colon cancer age 71 Last colonoscopy was 2019, unable to see report but was due 03/2023.  Patient has new appointment for 02/04 with cardiology for palpitations at rest, will reach out to cardiologist and schedule for colonoscopy after clearance.  We have discussed the risks of bleeding, infection, perforation, medication reactions, and remote risk of death associated with colonoscopy. All questions were answered and the patient acknowledges these risk and wishes to proceed.  Coronary Artery Disease Noted on recent CT scan. Patient reports occasional sensation of heart pounding but no associated chest pain. -Cardiology appointment scheduled for 09/20/2023 for further evaluation. -Obtain cardiac clearance prior to scheduling colonoscopy.  Abdominal Pain Chronic abdominal discomfort with recent changes in character, including sharper pains and a fluttering sensation. No associated reflux, heartburn, trouble swallowing, nausea, or vomiting. Pain is localized to the area of a known hernia and can be alleviated with massage. Recent CT with small hernia reoccurrence, fat containing only.  -Referral to general surgeon for evaluation of hernia. -Consider use of Salonpas patches for pain relief. - keep stools soft -Discussed ER precautions -Weight loss advised.  -Consider use of Gas-X for gas symptoms. -Consider trial of antispasmodic medication (IBgard) for abdominal discomfort.   Change in Bowel Habits Loose stools attributed to increased fruit and vegetable intake for cholesterol management. Stools are returning to normal consistency. No blood or dark black stool noted. -Continue high fiber diet to maintain soft stools, particularly important given history of  abdominal surgeries and possible adhesions. -Consider FODMAP diet modifications to reduce potential gas and bloating.  Diverticulosis Noted on recent CT scan. No symptoms of diverticulitis. -Provide patient with information on diverticulosis.    I have reviewed the clinic note as outlined by Quentin Mulling, PA and agree with the assessment, plan and medical decision making. Timothy Anthony presents to discuss surveillance colonoscopy in the setting of a previous history of polyps of unknown histology and colorectal cancer in his father at age 23.  Reports that he began colorectal cancer screening at age 43 and has undergone colonoscopy every 5 years due to his family history.  Last colonoscopy was performed in 2019 and he is now due for another procedure.  Also has had right lower quadrant and flank pain with a history of for hernia repairs.  Pain has been attributed to postsurgical changes and adhesions.  He has had recent symptoms of heart pounding with atherosclerosis identified on CT imaging.  He will be seen by cardiology in February 2025.  Pending the outcome of that evaluation the timing of his future colonoscopy can be determined.  Timothy Beach, MD  Patient Care Team: Saguier, Kateri Mc as PCP - General (Internal Medicine)  HISTORY OF PRESENT ILLNESS: 73 y.o. male with a past medical history of hypertension, hyperlipidemia, hypothyroidism, obesity BMI 35, history of recurrent right ventral hernia, GERD and others listed below presents for evaluation of abdominal discomfort and screening colonoscopy with family history of colon polyps..   He has had a colon screening starting at age 72, every 5 years due to his father having colon cancer at age 32.  Last colonoscopy was 03/2018 at [facility], recall 03/2023.  Unable to see report.  Had appendectomy 1993 with complications of abscess and peritonitis, with hernia.  He had hernia repair 1995,  2006, 2009, and 2012 had 4th repair.  Has been having  right flank and right lower abdominal pain for years due to adhesions, has been different/worse last 2 months. Had recent CT that showed small fat-containing right sided spigelian type hernia at previous site of repair.  Diverticulosis and nonobstructing left kidney stone. Showed CAD. Labs showed normal CBC normal kidney liver and normal lipase previously normal thyroid. Patient was referred to general surgery for further evaluation of recurrent hernia.  Discussed the use of AI scribe software for clinical note transcription with the patient, who gave verbal consent to proceed.  History of Present Illness   The patient, with a history of coronary artery disease and diverticulosis, presents with worsening discomfort in the area of a known hernia over the last two months. The discomfort is described as a charley horse-like sensation in the stomach and a fluttering sensation, particularly at night. The patient notes that the fluttering sensation can be alleviated by massaging the area of the hernia.  The patient also reports a change in bowel habits, with looser stools, which he attributes to an increase in fruit and vegetable intake for cholesterol management. However, the patient notes that his stools are starting to return to normal in the past week. The patient denies any blood or dark black stool.  The patient also mentions a recent COVID-19 infection and subsequent treatment with Flaxseed, which caused significant stomach upset and loss of appetite. The patient denies any reflux, heartburn, trouble swallowing, or nausea/vomiting outside of the Flaxseed treatment.  The patient also reports an episode of feeling his heart pounding while sitting and watching TV, with a blood pressure reading of 132/80 and a pulse of 90 at the time. The patient denies any associated pain but describes a throbbing sensation in his head. The patient is scheduled to see a cardiologist for further evaluation.       He   reports that he quit smoking about 46 years ago. His smoking use included cigarettes. He started smoking about 52 years ago. He has a 3 pack-year smoking history. He does not have any smokeless tobacco history on file. He reports current alcohol use. He reports that he does not use drugs.  RELEVANT GI HISTORY, LABS, IMAGING: 09/07/2023 CT abdomen pelvis without contrast for hernia suspected right sided abdominal wall flank and right lower abdominal pain remote history of abscess following hernia repair and mesh IMPRESSION: 1. Appearance of the right lower quadrant abdominal wall is unchanged, with evidence of prior hernia repair, and a small, fat containing spigelian type hernia. 2. Small nonobstructive calculus of the midportion of the left kidney. No right-sided calculi, ureteral calculi, or hydronephrosis. 3. Sigmoid diverticulosis without evidence of acute diverticulitis. 4. Coronary artery disease.  CBC    Component Value Date/Time   WBC 6.8 08/24/2023 1515   RBC 4.81 08/24/2023 1515   HGB 16.7 08/24/2023 1515   HCT 47.8 08/24/2023 1515   PLT 262.0 08/24/2023 1515   MCV 99.5 08/24/2023 1515   MCH 34.5 (H) 08/15/2022 1911   MCHC 34.9 08/24/2023 1515   RDW 12.3 08/24/2023 1515   LYMPHSABS 1.8 08/24/2023 1515   MONOABS 0.4 08/24/2023 1515   EOSABS 0.2 08/24/2023 1515   BASOSABS 0.0 08/24/2023 1515   Recent Labs    07/28/23 1031 08/24/23 1515  HGB 16.8 16.7    CMP     Component Value Date/Time   NA 137 08/24/2023 1515   K 4.2 08/24/2023 1515   CL 101 08/24/2023  1515   CO2 31 08/24/2023 1515   GLUCOSE 91 08/24/2023 1515   BUN 12 08/24/2023 1515   CREATININE 0.97 08/24/2023 1515   CALCIUM 9.4 08/24/2023 1515   PROT 7.0 08/24/2023 1515   ALBUMIN 4.4 08/24/2023 1515   AST 18 08/24/2023 1515   ALT 22 08/24/2023 1515   ALKPHOS 52 08/24/2023 1515   BILITOT 0.8 08/24/2023 1515   GFRNONAA >60 08/15/2022 1911      Latest Ref Rng & Units 08/24/2023    3:15 PM 07/28/2023    10:31 AM 08/15/2022    7:11 PM  Hepatic Function  Total Protein 6.0 - 8.3 g/dL 7.0  6.9  7.0   Albumin 3.5 - 5.2 g/dL 4.4  4.2  4.1   AST 0 - 37 U/L 18  17  25    ALT 0 - 53 U/L 22  17  22    Alk Phosphatase 39 - 117 U/L 52  48  49   Total Bilirubin 0.2 - 1.2 mg/dL 0.8  1.0  0.4       Current Medications:   Current Outpatient Medications (Endocrine & Metabolic):    levothyroxine (SYNTHROID) 150 MCG tablet, Take 150 mcg by mouth daily before breakfast.  Current Outpatient Medications (Cardiovascular):    perindopril (ACEON) 4 MG tablet, Take 1 tablet (4 mg total) by mouth daily.  Current Outpatient Medications (Respiratory):    fluticasone (FLONASE) 50 MCG/ACT nasal spray, Place 2 sprays into both nostrils daily. (Patient taking differently: Place 2 sprays into both nostrils as needed for allergies.)    Current Outpatient Medications (Other):    LORazepam (ATIVAN) 0.5 MG tablet, Take 1 tablet (0.5 mg total) by mouth every 8 (eight) hours. (Patient not taking: Reported on 07/27/2023)  Medical History:  Past Medical History:  Diagnosis Date   Anxiety    Colon polyp    Coronary artery disease    Diverticulosis    Gallstones    Hyperlipidemia    Hypertension    Hypothyroidism    Allergies: No Known Allergies   Surgical History:  He  has a past surgical history that includes calcium deposits removal (Bilateral, 09/2006 and 09/2015); Appendectomy (1993); and Hernia repair (1995,2003,2005,2006,2011). Family History:  His family history includes Cancer in his brother; Colon cancer in his father; Heart disease in his brother.  REVIEW OF SYSTEMS  : All other systems reviewed and negative except where noted in the History of Present Illness.  PHYSICAL EXAM: BP 130/82 (BP Location: Left Arm, Patient Position: Sitting, Cuff Size: Normal)   Pulse 87   Ht 6\' 1"  (1.854 m)   Wt 269 lb 2 oz (122.1 kg)   SpO2 91%   BMI 35.51 kg/m  General Appearance: Well nourished, in no  apparent distress. Head:   Normocephalic and atraumatic. Eyes:  sclerae anicteric,conjunctive pink  Respiratory: Respiratory effort normal, BS equal bilaterally without rales, rhonchi, wheezing. Cardio: RRR with no MRGs. Peripheral pulses intact.  Abdomen: Soft,  Obese ,active bowel sounds. mild tenderness in the RLQ. Without guarding and Without rebound.  Multiple surgical scars right lower abdomen well-healed.  No masses. Rectal: Not evaluated Musculoskeletal: Full ROM, Normal gait. Without edema. Skin:  Dry and intact without significant lesions or rashes Neuro: Alert and  oriented x4;  No focal deficits. Psych:  Cooperative. Normal mood and affect.    Doree Albee, PA-C 10:26 AM

## 2023-09-09 ENCOUNTER — Telehealth: Payer: Self-pay

## 2023-09-09 ENCOUNTER — Ambulatory Visit: Payer: Medicare Other | Admitting: Physician Assistant

## 2023-09-09 ENCOUNTER — Encounter: Payer: Self-pay | Admitting: Physician Assistant

## 2023-09-09 VITALS — BP 130/82 | HR 87 | Ht 73.0 in | Wt 269.1 lb

## 2023-09-09 DIAGNOSIS — G8929 Other chronic pain: Secondary | ICD-10-CM

## 2023-09-09 DIAGNOSIS — K5730 Diverticulosis of large intestine without perforation or abscess without bleeding: Secondary | ICD-10-CM

## 2023-09-09 DIAGNOSIS — Z800 Family history of malignant neoplasm of digestive organs: Secondary | ICD-10-CM

## 2023-09-09 DIAGNOSIS — Z860100 Personal history of colon polyps, unspecified: Secondary | ICD-10-CM

## 2023-09-09 DIAGNOSIS — R002 Palpitations: Secondary | ICD-10-CM | POA: Diagnosis not present

## 2023-09-09 DIAGNOSIS — R1031 Right lower quadrant pain: Secondary | ICD-10-CM

## 2023-09-09 DIAGNOSIS — R194 Change in bowel habit: Secondary | ICD-10-CM | POA: Diagnosis not present

## 2023-09-09 NOTE — Patient Instructions (Addendum)
_______________________________________________________  If your blood pressure at your visit was 140/90 or greater, please contact your primary care physician to follow up on this.  _______________________________________________________  If you are age 71 or older, your body mass index should be between 23-30. Your Body mass index is 35.51 kg/m. If this is out of the aforementioned range listed, please consider follow up with your Primary Care Provider.  If you are age 14 or younger, your body mass index should be between 19-25. Your Body mass index is 35.51 kg/m. If this is out of the aformentioned range listed, please consider follow up with your Primary Care Provider.   ________________________________________________________  The [facility] providers would like to encourage you to use [facility] to communicate with providers for non-urgent requests or questions.  Due to long hold times on the telephone, sending your provider a message by [facility] may be a faster and more efficient way to get a response.  Please allow 48 business hours for a response.  Please remember that this is for non-urgent requests.  _______________________________________________________    Wallene Huh patches are over the counter and voltern gel is topical antiinflammatory that is safe.   First do a trial off milk/lactose products if you use them.  Add fiber like benefiber or citracel once a day Can do trial of IBGard which is over the counter for AB pain- Take 1-2 capsules once a day for maintence or twice a day during a flare   FODMAP stands for fermentable oligo-, di-, mono-saccharides and polyols (1). These are the scientific terms used to classify groups of carbs that are difficult for our body to digest and that are notorious for triggering digestive symptoms like bloating, gas, loose stools and stomach pain.   You can try low FODMAP diet  - start with eliminating just one column at a time that you feel may  be a trigger for you. - the table at the very bottom contains foods that are low in FODMAPs   Sometimes trying to eliminate the FODMAP's from your diet is difficult or tricky, if you are stuggling with trying to do the elimination diet you can try an enzyme.  There is a food enzymes that you sprinkle in or on your food that helps break down the FODMAP. You can read more about the enzyme by going to this site: https://fodzyme.com/    Diverticulosis Diverticulosis is a condition that develops when small pouches (diverticula) form in the wall of the large intestine (colon). The colon is where water is absorbed and stool (feces) is formed. The pouches form when the inside layer of the colon pushes through weak spots in the outer layers of the colon. You may have a few pouches or many of them. The pouches usually do not cause problems unless they become inflamed or infected. When this happens, the condition is called diverticulitis- this is left lower quadrant pain, diarrhea, fever, chills, nausea or vomiting.  If this occurs please call the office or go to the hospital. Sometimes these patches without inflammation can also have painless bleeding associated with them, if this happens please call the office or go to the hospital. Preventing constipation and increasing fiber can help reduce diverticula and prevent complications. Even if you feel you have a high-fiber diet, suggest getting on Benefiber or Cirtracel 2 times daily.  It was a pleasure to see you today!  Thank you for trusting me with your gastrointestinal care!

## 2023-09-09 NOTE — Telephone Encounter (Addendum)
   Name: Timothy Anthony  DOB: 03-28-1951  MRN: [id number]  Primary Cardiologist: None  Chart reviewed as part of pre-operative protocol coverage. The patient has an upcoming visit scheduled with Dr. Vincent Gros on 09/20/2023 at which time clearance can be addressed in case there are any issues that would impact surgical recommendations.  Colonoscopy is not scheduled until TBD as below. I added preop FYI to appointment note so that provider is aware to address at time of outpatient visit.  Per office protocol the cardiology provider should forward their finalized clearance decision and recommendations regarding antiplatelet therapy to the requesting party below.    I will route this message as FYI to requesting party and remove this message from the preop box as separate preop APP input not needed at this time.   Please call with any questions.  Joylene Grapes, NP  09/09/2023, 10:23 AM

## 2023-09-09 NOTE — Telephone Encounter (Signed)
[facility] Pre-operative Risk Assessment     Request for surgical clearance:     Endoscopy Procedure  What type of surgery is being performed?     Colonoscopy  When is this surgery scheduled?     To be determined   What type of clearance is required ?   Cardiac Clearance.  Are there any medications that need to be held prior to surgery and how long? None. Patient is seeing Cardiology in 09/20/23 as a new patient please provide cardiac clearance for screening Colonoscopy.  Practice name and name of physician performing surgery?      [facility]  What is your office phone and fax number?      Phone- [phone]  Fax- [phone]  Anesthesia type (None, local, MAC, general) ?       MAC   Please route your response to [facility]

## 2023-09-13 ENCOUNTER — Encounter: Payer: Self-pay | Admitting: Urology

## 2023-09-13 ENCOUNTER — Ambulatory Visit: Payer: Medicare Other | Admitting: Urology

## 2023-09-13 VITALS — BP 141/90 | HR 80 | Ht 73.0 in | Wt 265.0 lb

## 2023-09-13 DIAGNOSIS — R350 Frequency of micturition: Secondary | ICD-10-CM

## 2023-09-13 DIAGNOSIS — R399 Unspecified symptoms and signs involving the genitourinary system: Secondary | ICD-10-CM | POA: Diagnosis not present

## 2023-09-13 LAB — URINALYSIS, ROUTINE W REFLEX MICROSCOPIC
Bilirubin, UA: NEGATIVE
Glucose, UA: NEGATIVE
Ketones, UA: NEGATIVE
Leukocytes,UA: NEGATIVE
Nitrite, UA: NEGATIVE
Protein,UA: NEGATIVE
RBC, UA: NEGATIVE
Specific Gravity, UA: 1.02 (ref 1.005–1.030)
Urobilinogen, Ur: 0.2 mg/dL (ref 0.2–1.0)
pH, UA: 6 (ref 5.0–7.5)

## 2023-09-13 LAB — BLADDER SCAN AMB NON-IMAGING

## 2023-09-13 NOTE — Progress Notes (Signed)
Assessment: 1. Lower urinary tract symptoms (LUTS)      Plan: Today had a long discussion with the patient regarding his lower urinary tract symptoms including the natural history and options for management.  At this time he does not feel that his degree of bother warrants prescription medication. Follow-up as needed  Chief Complaint: LUTS  History of Present Illness:  Timothy Anthony is a 73 y.o. male who is seen in consultation from [place], [place], [place] for evaluation of lower urinary tract symptoms. Patient reports slowly progressive lower urinary tract symptoms over the last 5 or 6 years. Current IPSS = 14/3 PSA 07/2023 = 0.53 PVR =2ml DRE reveals an approximately 30 g prostate without evidence of nodules or induration  Past Medical History:  Past Medical History:  Diagnosis Date   Anxiety    Colon polyp    Coronary artery disease    Diverticulosis    Gallstones    Hyperlipidemia    Hypertension    Hypothyroidism     Past Surgical History:  Past Surgical History:  Procedure Laterality Date   APPENDECTOMY  1993   calcium deposits removal Bilateral 09/2006 and 09/2015   off the eyelids   HERNIA REPAIR  1995,2003,2005,2006,2011   hernia repairs after each surgery    Allergies:  No Known Allergies  Family History:  Family History  Problem Relation Age of Onset   Colon cancer Father    Cancer Brother    Heart disease Brother    Allergic rhinitis Neg Hx    Angioedema Neg Hx    Asthma Neg Hx    Eczema Neg Hx    Immunodeficiency Neg Hx    Urticaria Neg Hx     Social History:  Social History   Tobacco Use   Smoking status: Former    Current packs/day: 0.00    Average packs/day: 0.5 packs/day for 6.0 years (3.0 ttl pk-yrs)    Types: Cigarettes    Start date: 08/20/1971    Quit date: 08/19/1977    Years since quitting: 46.0  Vaping Use   Vaping status: Never Used  Substance Use Topics   Alcohol use: Yes    Comment: rare in summers.   Drug use: No     Review of symptoms:  Constitutional:  Negative for unexplained weight loss, night sweats, fever, chills ENT:  Negative for nose bleeds, sinus pain, painful swallowing CV:  Negative for chest pain, shortness of breath, exercise intolerance, palpitations, loss of consciousness Resp:  Negative for cough, wheezing, shortness of breath GI:  Negative for nausea, vomiting, diarrhea, bloody stools GU:  Positives noted in HPI; otherwise negative for gross hematuria, dysuria, urinary incontinence Neuro:  Negative for seizures, poor balance, limb weakness, slurred speech Psych:  Negative for lack of energy, depression, anxiety Endocrine:  Negative for polydipsia, polyuria, symptoms of hypoglycemia (dizziness, hunger, sweating) Hematologic:  Negative for anemia, purpura, petechia, prolonged or excessive bleeding, use of anticoagulants  Allergic:  Negative for difficulty breathing or choking as a result of exposure to anything; no shellfish allergy; no allergic response (rash/itch) to materials, foods  Physical exam: BP (!) 141/90   Pulse 80   Ht 6\' 1"  (1.854 m)   Wt 265 lb (120.2 kg)   BMI 34.96 kg/m  GENERAL APPEARANCE:  Well appearing, well developed, well nourished, NAD  GU: Normal external genitalia DRE: Normal sphincter tone; prostate is approximately 40 g without evidence of nodules or induration  Results: Results for orders placed or performed in  visit on 09/13/23 (from the past 24 hours)  BLADDER SCAN AMB NON-IMAGING   Collection Time: 09/13/23 12:00 AM  Result Value Ref Range   Scan Result 11ml

## 2023-09-16 ENCOUNTER — Ambulatory Visit (INDEPENDENT_AMBULATORY_CARE_PROVIDER_SITE_OTHER): Payer: Medicare Other | Admitting: Medical

## 2023-09-16 VITALS — BP 124/82 | HR 71 | Resp 18 | Ht 73.0 in | Wt 266.0 lb

## 2023-09-16 DIAGNOSIS — R519 Headache, unspecified: Secondary | ICD-10-CM | POA: Diagnosis not present

## 2023-09-16 DIAGNOSIS — N200 Calculus of kidney: Secondary | ICD-10-CM | POA: Diagnosis not present

## 2023-09-16 DIAGNOSIS — K439 Ventral hernia without obstruction or gangrene: Secondary | ICD-10-CM | POA: Diagnosis not present

## 2023-09-16 DIAGNOSIS — K5790 Diverticulosis of intestine, part unspecified, without perforation or abscess without bleeding: Secondary | ICD-10-CM | POA: Diagnosis not present

## 2023-09-16 DIAGNOSIS — E785 Hyperlipidemia, unspecified: Secondary | ICD-10-CM

## 2023-09-16 LAB — LIPID PANEL
Cholesterol: 186 mg/dL (ref 0–200)
HDL: 31.8 mg/dL — ABNORMAL LOW (ref >39.00)
LDL Cholesterol: 122 mg/dL — ABNORMAL HIGH (ref 0–99)
NonHDL: 154.25
Total CHOL/HDL Ratio: 6
Triglycerides: 159 mg/dL — ABNORMAL HIGH (ref 0.0–149.0)
VLDL: 31.8 mg/dL (ref 0.0–40.0)

## 2023-09-16 LAB — SEDIMENTATION RATE: Sed Rate: 6 mm/h (ref 0–20)

## 2023-09-16 NOTE — Progress Notes (Signed)
Subjective:    Patient ID: Timothy Anthony, male    DOB: 01-May-1951, 73 y.o.   MRN: [id number]  HPI Discussed the use of AI scribe software for clinical note transcription with the patient, who gave verbal consent to proceed.  History of Present Illness   The patient, with diverticulosis and coronary artery disease, presents with right flank pain. He discussed these symptoms with Marchelle Folks from [facility] last Thursday.  He has been experiencing persistent right flank pain, which he associates with his known condition of diverticulosis. The pain is accompanied by increased gas and correlates with eating. No recent colonoscopy has been performed, but one is scheduled after a cardiology consultation next Tuesday. A previous colonoscopy five years ago showed no right-sided diverticulosis.  Dietary management includes a high-fiber diet and avoidance of lactose and caffeine. He is currently using Benefiber as part of his dietary adjustments. He expresses confusion about dietary recommendations found online, noting conflicting advice for diverticulitis and diverticulosis.  A recent CT scan did not comment on right-sided diverticulosis but noted sigmoid diverticulosis. Additionally, a small, nonobstructive kidney stone was identified in the mid portion of the left kidney. He is monitoring for symptoms.  He has coronary artery disease, with incidental findings noted on a recent CT scan of the abdomen. He has experienced a pounding heart sensation after eating in the past. An EKG showed sinus rhythm with slow conduction, and he is awaiting further evaluation by a cardiologist.   No current chest pain/cardiac features.                Past Medical History:  Diagnosis Date   Anxiety    Colon polyp    Coronary artery disease    Diverticulosis    Gallstones    Hyperlipidemia    Hypertension    Hypothyroidism      Social History   Socioeconomic History   Marital status: Married    Spouse name:  Not on file   Number of children: Not on file   Years of education: Not on file   Highest education level: Not on file  Occupational History   Not on file  Tobacco Use   Smoking status: Former    Current packs/day: 0.00    Average packs/day: 0.5 packs/day for 6.0 years (3.0 ttl pk-yrs)    Types: Cigarettes    Start date: 08/20/1971    Quit date: 08/19/1977    Years since quitting: 46.1   Smokeless tobacco: Not on file  Vaping Use   Vaping status: Never Used  Substance and Sexual Activity   Alcohol use: Yes    Comment: rare in summers.   Drug use: No   Sexual activity: Not on file  Other Topics Concern   Not on file  Social History Narrative   Not on file   Social Drivers of Health   Financial Resource Strain: Low Risk  (11/22/2022)   Overall Financial Resource Strain (CARDIA)    Difficulty of Paying Living Expenses: Not hard at all  Food Insecurity: No Food Insecurity (11/22/2022)   Hunger Vital Sign    Worried About Running Out of Food in the Last Year: Never true    Ran Out of Food in the Last Year: Never true  Transportation Needs: No Transportation Needs (11/22/2022)   PRAPARE - Administrator, Civil Service (Medical): No    Lack of Transportation (Non-Medical): No  Physical Activity: Inactive (11/22/2022)   Exercise Vital Sign  Days of Exercise per Week: 0 days    Minutes of Exercise per Session: 0 min  Stress: No Stress Concern Present (11/22/2022)   [employer] of Occupational Health - Occupational Stress Questionnaire    Feeling of Stress : Not at all  Social Connections: Moderately Isolated (11/22/2022)   Social Connection and Isolation Panel [NHANES]    Frequency of Communication with Friends and Family: Twice a week    Frequency of Social Gatherings with Friends and Family: Once a week    Attends Religious Services: Never    Database administrator or Organizations: No    Attends Banker Meetings: Never    Marital Status: Married   Catering manager Violence: Not At Risk (11/22/2022)   Humiliation, Afraid, Rape, and Kick questionnaire    Fear of Current or Ex-Partner: No    Emotionally Abused: No    Physically Abused: No    Sexually Abused: No    Past Surgical History:  Procedure Laterality Date   APPENDECTOMY  1993   calcium deposits removal Bilateral 09/2006 and 09/2015   off the eyelids   HERNIA REPAIR  1995,2003,2005,2006,2011   hernia repairs after each surgery    Family History  Problem Relation Age of Onset   Colon cancer Father    Cancer Brother    Heart disease Brother    Allergic rhinitis Neg Hx    Angioedema Neg Hx    Asthma Neg Hx    Eczema Neg Hx    Immunodeficiency Neg Hx    Urticaria Neg Hx     No Known Allergies  Current Outpatient Medications on File Prior to Visit  Medication Sig Dispense Refill   fluticasone (FLONASE) 50 MCG/ACT nasal spray Place 2 sprays into both nostrils daily. (Patient taking differently: Place 2 sprays into both nostrils as needed for allergies.) 16 g 1   levothyroxine (SYNTHROID) 150 MCG tablet Take 150 mcg by mouth daily before breakfast.     perindopril (ACEON) 4 MG tablet Take 1 tablet (4 mg total) by mouth daily. 90 tablet 3   No current facility-administered medications on file prior to visit.    BP 124/82   Pulse 71   Resp 18   Ht 6\' 1"  (1.854 m)   Wt 266 lb (120.7 kg)   SpO2 96%   BMI 35.09 kg/m        Review of Systems  Constitutional:  Negative for chills, fatigue and fever.  HENT:  Negative for dental problem, ear pain, mouth sores and nosebleeds.   Respiratory:  Negative for chest tightness, shortness of breath and wheezing.   Cardiovascular:  Negative for chest pain and palpitations.  Gastrointestinal:  Positive for abdominal pain. Negative for abdominal distention, anal bleeding, constipation, diarrhea, rectal pain and vomiting.       Same low level rt flank pain on palpation.  Genitourinary:  Negative for dysuria.   Musculoskeletal:  Negative for back pain and neck pain.  Skin:  Negative for rash.  Neurological:  Negative for dizziness, weakness and light-headedness.       See hpi  Hematological:  Negative for adenopathy. Does not bruise/bleed easily.  Psychiatric/Behavioral:  Negative for behavioral problems and dysphoric mood.        Objective:   Physical Exam  General Mental Status- Alert. General Appearance- Not in acute distress.    Skin General: Color- Normal Color. Moisture- Normal Moisture.   Neck Carotid Arteries- Normal color. Moisture- Normal Moisture. No carotid bruits. No JVD.  Chest and Lung Exam Auscultation: Breath Sounds:-Normal.   Cardiovascular Auscultation:Rythm- Regular. Murmurs & Other Heart Sounds:Auscultation of the heart reveals- No Murmurs.   Abdomen Inspection:-Inspeection Normal. Palpation/Percussion:Note: Palpation and Percussion of the abdomen reveal- slight bulge rt side abdomen/flank. Bulge mild protrudes but about 8 cm x 4 cm. Faint Tender, in this area and toward rt rlq area Non Distended + BS, no rebound or guarding.     Neurologic Cranial Nerve exam:- CN III-XII intact(No nystagmus), symmetric smile. Strength:- 5/5 equal and symmetric strength both upper and lower extremities.       Assessment & Plan:   Patient Instructions  Diverticulosis(hx of) Right flank pain and increased gas. Pain is localized to the area of known diverticulosis. Plan to manage with dietary modifications as advised by GI specialist. -Follow dietary instructions provided by GI specialist. -Schedule colonoscopy after cardiology consultation.  Atypical Hernia rt flank.(Spigelian hernia) Pain in the area of hernia. Plan to manage conservatively until diverticulosis is under control. -Consider surgical consultation if pain persists after diverticulosis is managed. Declined referral to surgeon presently. -if any severe rt side flank pain indicating complication from hernia  be seen in the ED.  Non-obstructive Kidney Stone Small, non-obstructive kidney stone identified on CT.  No current symptoms. -Monitor for symptoms of kidney stone passage.  Coronary Artery Disease Incidental findings on CT abdomen  EKG showed 1st degree av block. Pt has already been referred. -Keep follow-up appointment with cardiologist. -Discuss CT and EKG findings with cardiologist.  For intermittent rt side pounding sensation side of face and temporal area. Occurred twice 30 minutes each time. No neurologic signs or symptoms. No family hx of brain aneurysm. no pain.  - Advise to check bp during these events. you know you baseline bp want to compare. -if every occurs with neurologic signs/symptoms/deficits be seen in ED. -update me on my chart when these events occur. Consider referring to specialist or work up based on further clinical information. -on discussion some temporal pounding on those 2 events so will get sed rate today  Follow up in 3 weeks or sooner if needed   Time spent with patient today was 45  minutes which consisted of chart review, discussing diagnosis, work up, treatment and documentation.

## 2023-09-16 NOTE — Patient Instructions (Addendum)
Diverticulosis(hx of) Right flank pain and increased gas. Pain is localized to the area of known diverticulosis. Plan to manage with dietary modifications as advised by GI specialist. -Follow dietary instructions provided by GI specialist. -Schedule colonoscopy after cardiology consultation.  Atypical Hernia rt flank.(Spigelian hernia) Pain in the area of hernia. Plan to manage conservatively until diverticulosis is under control. -Consider surgical consultation if pain persists after diverticulosis is managed. Declined referral to surgeon presently. -if any severe rt side flank pain indicating complication from hernia be seen in the ED.  Non-obstructive Kidney Stone Small, non-obstructive kidney stone identified on CT.  No current symptoms. -Monitor for symptoms of kidney stone passage.  Coronary Artery Disease Incidental findings on CT abdomen  EKG showed 1st degree av block. Pt has already been referred. -Keep follow-up appointment with cardiologist. -Discuss CT and EKG findings with cardiologist.  For intermittent rt side pounding sensation side of face and temporal area. Occurred twice 30 minutes each time. No neurologic signs or symptoms. No family hx of brain aneurysm. no pain.  - Advise to check bp during these events. you know you baseline bp want to compare. -if every occurs with neurologic signs/symptoms/deficits be seen in ED. -update me on my chart when these events occur. Consider referring to specialist or work up based on further clinical information. -on discussion some temporal pounding on those 2 events so will get sed rate today  Follow up in 3 weeks or sooner if needed

## 2023-09-17 ENCOUNTER — Encounter: Payer: Self-pay | Admitting: Medical

## 2023-09-19 ENCOUNTER — Encounter: Payer: Self-pay | Admitting: Medical

## 2023-09-19 ENCOUNTER — Ambulatory Visit (INDEPENDENT_AMBULATORY_CARE_PROVIDER_SITE_OTHER): Payer: Medicare Other | Admitting: Medical

## 2023-09-19 VITALS — BP 124/74 | HR 79 | Resp 18 | Ht 73.0 in | Wt 262.0 lb

## 2023-09-19 DIAGNOSIS — K8020 Calculus of gallbladder without cholecystitis without obstruction: Secondary | ICD-10-CM | POA: Insufficient documentation

## 2023-09-19 DIAGNOSIS — I10 Essential (primary) hypertension: Secondary | ICD-10-CM | POA: Insufficient documentation

## 2023-09-19 DIAGNOSIS — K5792 Diverticulitis of intestine, part unspecified, without perforation or abscess without bleeding: Secondary | ICD-10-CM | POA: Diagnosis not present

## 2023-09-19 DIAGNOSIS — K635 Polyp of colon: Secondary | ICD-10-CM | POA: Insufficient documentation

## 2023-09-19 DIAGNOSIS — E871 Hypo-osmolality and hyponatremia: Secondary | ICD-10-CM

## 2023-09-19 DIAGNOSIS — R1032 Left lower quadrant pain: Secondary | ICD-10-CM | POA: Diagnosis not present

## 2023-09-19 DIAGNOSIS — F419 Anxiety disorder, unspecified: Secondary | ICD-10-CM | POA: Insufficient documentation

## 2023-09-19 DIAGNOSIS — E785 Hyperlipidemia, unspecified: Secondary | ICD-10-CM | POA: Insufficient documentation

## 2023-09-19 DIAGNOSIS — I2510 Atherosclerotic heart disease of native coronary artery without angina pectoris: Secondary | ICD-10-CM | POA: Insufficient documentation

## 2023-09-19 DIAGNOSIS — K5790 Diverticulosis of intestine, part unspecified, without perforation or abscess without bleeding: Secondary | ICD-10-CM | POA: Insufficient documentation

## 2023-09-19 DIAGNOSIS — E039 Hypothyroidism, unspecified: Secondary | ICD-10-CM | POA: Insufficient documentation

## 2023-09-19 LAB — CBC WITH DIFFERENTIAL/PLATELET
Basophils Absolute: 0 10*3/uL (ref 0.0–0.1)
Basophils Relative: 0.3 % (ref 0.0–3.0)
Eosinophils Absolute: 0.1 10*3/uL (ref 0.0–0.7)
Eosinophils Relative: 1 % (ref 0.0–5.0)
HCT: 47.2 % (ref 39.0–52.0)
Hemoglobin: 16.5 g/dL (ref 13.0–17.0)
Lymphocytes Relative: 14 % (ref 12.0–46.0)
Lymphs Abs: 1 10*3/uL (ref 0.7–4.0)
MCHC: 34.9 g/dL (ref 30.0–36.0)
MCV: 101.4 fL — ABNORMAL HIGH (ref 78.0–100.0)
Monocytes Absolute: 0.3 10*3/uL (ref 0.1–1.0)
Monocytes Relative: 4.1 % (ref 3.0–12.0)
Neutro Abs: 5.6 10*3/uL (ref 1.4–7.7)
Neutrophils Relative %: 80.6 % — ABNORMAL HIGH (ref 43.0–77.0)
Platelets: 253 10*3/uL (ref 150.0–400.0)
RBC: 4.66 Mil/uL (ref 4.22–5.81)
RDW: 13.4 % (ref 11.5–15.5)
WBC: 6.9 10*3/uL (ref 4.0–10.5)

## 2023-09-19 LAB — COMPREHENSIVE METABOLIC PANEL
ALT: 15 U/L (ref 0–53)
AST: 16 U/L (ref 0–37)
Albumin: 4.2 g/dL (ref 3.5–5.2)
Alkaline Phosphatase: 54 U/L (ref 39–117)
BUN: 15 mg/dL (ref 6–23)
CO2: 24 meq/L (ref 19–32)
Calcium: 9.1 mg/dL (ref 8.4–10.5)
Chloride: 108 meq/L (ref 96–112)
Creatinine, Ser: 0.96 mg/dL (ref 0.40–1.50)
GFR: 80.09 mL/min (ref >60.00)
Glucose, Bld: 66 mg/dL — ABNORMAL LOW (ref 70–99)
Potassium: 3.9 meq/L (ref 3.5–5.1)
Sodium: 126 meq/L — ABNORMAL LOW (ref 135–145)
Total Bilirubin: 1.3 mg/dL — ABNORMAL HIGH (ref 0.2–1.2)
Total Protein: 6.9 g/dL (ref 6.0–8.3)

## 2023-09-19 MED ORDER — METRONIDAZOLE 500 MG PO TABS: 500.0000 mg | ORAL_TABLET | Freq: Three times a day (TID) | ORAL | 0 refills | Status: AC

## 2023-09-19 MED ORDER — CIPROFLOXACIN HCL 500 MG PO TABS: 500.0000 mg | ORAL_TABLET | Freq: Two times a day (BID) | ORAL | 0 refills | Status: DC

## 2023-09-19 NOTE — Addendum Note (Signed)
Addended by: Gwenevere Abbot on: 09/19/2023 08:17 PM   Modules accepted: Orders

## 2023-09-19 NOTE — Patient Instructions (Addendum)
Diverticulitis New onset left lower quadrant pain, initially sharp and now more constant and point tender. No fever, but some loose stools and gas. History of diverticulosis on the left side. Mild early diverticulitis suspected. -Order CBC to assess for infection. Also get cmp -Start Ciprofloxacin 500mg  BID for 7 days and Metronidazole (Flagyl) TID for 7 days. -Notify GI team of recent suspected diverticulitis event in light of upcoming colonoscopy. -Follow-up on labs and clinical response to antibiotics.  Right-sided Abdominal Pain Chronic intermittent pain due to known hernia. -Continue monitoring, report any changes in pain.  Follow up in 7-10 days or sooner if needed based on how you do clinically and lab results.

## 2023-09-19 NOTE — Progress Notes (Signed)
Subjective:    Patient ID: Timothy Anthony, male    DOB: 1951-04-05, 73 y.o.   MRN: [id number]  HPI  Discussed the use of AI scribe software for clinical note transcription with the patient, who gave verbal consent to proceed.  History of Present Illness   The patient presents with left-sided abdominal pain.  He has been experiencing sharp, stabbing pain on the left side of his abdomen since early Saturday morning. Initially, the pain was a quick, sharp sensation when he sat down after making coffee, but it evolved into a constant pain over a five-inch diameter area, becoming more point tender. Currently, the pain is not felt unless the area is palpated, at which point it is tender. No fever, chills, or sweats are present.  He reports diarrhea, described as mild loose stools that felt more like gas, occurring less than three or four times a day. No constipation. He attempted a liquid diet, which seemed to alleviate the symptoms. Since Saturday morning, he has only consumed water, sports drinks, and chicken broth.  He has a history of right-sided abdominal pain and a hernia on the right side. He recalls a previous discussion about having diverticulosis on the left side, which aligns with the current location of his pain. His wife has a history of diverticulitis.        Review of Systems  Constitutional:  Negative for chills, fatigue and fever.  Respiratory:  Negative for cough, chest tightness, shortness of breath and wheezing.   Cardiovascular:  Negative for chest pain and palpitations.  Gastrointestinal:  Positive for abdominal pain. Negative for constipation and nausea.       Looser stools.  Musculoskeletal:  Negative for back pain and myalgias.  Skin:  Negative for pallor and rash.  Neurological:  Negative for dizziness, syncope and light-headedness.  Psychiatric/Behavioral:  Negative for behavioral problems.     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   Anxiety    Colon polyp    Coronary artery disease    Diverticulosis    Essential hypertension 08/24/2021   Excessive gas 05/28/2019   Gallstones    Gastroesophageal reflux disease without esophagitis 01/13/2016   Hyperlipidemia    Hypertension    Hypothyroidism    Labral tear of shoulder, degenerative, 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   Mixed hyperlipidemia 08/07/2020   Ventral hernia without obstruction or gangrene 08/24/2021     Social History   Socioeconomic History   Marital status: Married    Spouse name: Not on file   Number of children: Not on file   Years of education: Not on file   Highest education level: 12th grade  Occupational History   Not on file  Tobacco Use   Smoking status: Former    Current packs/day: 0.00    Average packs/day: 0.5 packs/day for 6.0 years (3.0 ttl pk-yrs)    Types: Cigarettes    Start date: 08/20/1971    Quit date: 08/19/1977    Years since quitting: 46.1   Smokeless tobacco: Not on file  Vaping Use   Vaping status: Never Used  Substance and Sexual Activity   Alcohol use: Yes    Comment: rare in summers.   Drug use: No   Sexual activity: Not on file  Other Topics Concern   Not on file  Social History Narrative   Not on file   Social Drivers of Health   Financial Resource  Strain: Low Risk  (09/19/2023)   Overall Financial Resource Strain (CARDIA)    Difficulty of Paying Living Expenses: Not hard at all  Food Insecurity: No Food Insecurity (09/19/2023)   Hunger Vital Sign    Worried About Running Out of Food in the Last Year: Never true    Ran Out of Food in the Last Year: Never true  Transportation Needs: No Transportation Needs (09/19/2023)   PRAPARE - Administrator, Civil Service (Medical): No    Lack of Transportation (Non-Medical): No  Physical Activity: Sufficiently Active (09/19/2023)   Exercise Vital Sign    Days of Exercise per Week: 4 days    Minutes of Exercise  per Session: 150+ min  Stress: No Stress Concern Present (09/19/2023)   [employer] of Occupational Health - Occupational Stress Questionnaire    Feeling of Stress : Not at all  Social Connections: Moderately Isolated (09/19/2023)   Social Connection and Isolation Panel [NHANES]    Frequency of Communication with Friends and Family: More than three times a week    Frequency of Social Gatherings with Friends and Family: Three times a week    Attends Religious Services: Never    Active Member of Clubs or Organizations: No    Attends Banker Meetings: Never    Marital Status: Married  Catering manager Violence: Not At Risk (11/22/2022)   Humiliation, Afraid, Rape, and Kick questionnaire    Fear of Current or Ex-Partner: No    Emotionally Abused: No    Physically Abused: No    Sexually Abused: No    Past Surgical History:  Procedure Laterality Date   APPENDECTOMY  1993   calcium deposits removal Bilateral 09/2006 and 09/2015   off the eyelids   HERNIA REPAIR  1995,2003,2005,2006,2011   hernia repairs after each surgery    Family History  Problem Relation Age of Onset   Colon cancer Father    Cancer Brother    Heart disease Brother    Allergic rhinitis Neg Hx    Angioedema Neg Hx    Asthma Neg Hx    Eczema Neg Hx    Immunodeficiency Neg Hx    Urticaria Neg Hx     No Known Allergies  Current Outpatient Medications on File Prior to Visit  Medication Sig Dispense Refill   fluticasone (FLONASE) 50 MCG/ACT nasal spray Place 2 sprays into both nostrils daily. (Patient taking differently: Place 2 sprays into both nostrils as needed for allergies.) 16 g 1   levothyroxine (SYNTHROID) 150 MCG tablet Take 150 mcg by mouth daily before breakfast.     perindopril (ACEON) 4 MG tablet Take 1 tablet (4 mg total) by mouth daily. 90 tablet 3   No current facility-administered medications on file prior to visit.    BP 124/74   Pulse 79   Resp 18   Ht 6\' 1"  (1.854 m)   Wt  262 lb (118.8 kg)   SpO2 97%   BMI 34.57 kg/m        Objective:   Physical Exam  General- No acute distress. Pleasant patient. Neck- Full range of motion, no jvd Lungs- Clear, even and unlabored. Heart- regular rate and rhythm. Neurologic- CNII- XII grossly intact.  Back- no cva tenderness. Abdomen- left lower quadrant- small point tender area. No rebound or guarding. Mild- moderate tender/      Assessment & Plan:   Patient Instructions  Diverticulitis New onset left lower quadrant pain, initially sharp and now more  constant and point tender. No fever, but some loose stools and gas. History of diverticulosis on the left side. Mild early diverticulitis suspected. -Order CBC to assess for infection. -Start Ciprofloxacin 500mg  BID for 7 days and Metronidazole (Flagyl) TID for 7 days. -Notify GI team of recent suspected diverticulitis event in light of upcoming colonoscopy. -Follow-up on labs and clinical response to antibiotics.  Right-sided Abdominal Pain Chronic intermittent pain due to known hernia. -Continue monitoring, report any changes in pain.  Follow up in 7-10 days or sooner if needed based on how you do clinically and lab results.    Esperanza Richters, PA-C

## 2023-09-20 ENCOUNTER — Other Ambulatory Visit: Payer: Medicare Other

## 2023-09-20 ENCOUNTER — Ambulatory Visit: Payer: Medicare Other

## 2023-09-20 ENCOUNTER — Other Ambulatory Visit (INDEPENDENT_AMBULATORY_CARE_PROVIDER_SITE_OTHER): Payer: Medicare Other

## 2023-09-20 ENCOUNTER — Encounter: Payer: Self-pay | Admitting: Medical

## 2023-09-20 VITALS — BP 120/70 | HR 84 | Ht 73.0 in | Wt 262.0 lb

## 2023-09-20 DIAGNOSIS — I10 Essential (primary) hypertension: Secondary | ICD-10-CM | POA: Diagnosis not present

## 2023-09-20 DIAGNOSIS — E782 Mixed hyperlipidemia: Secondary | ICD-10-CM | POA: Diagnosis not present

## 2023-09-20 DIAGNOSIS — E871 Hypo-osmolality and hyponatremia: Secondary | ICD-10-CM

## 2023-09-20 DIAGNOSIS — R002 Palpitations: Secondary | ICD-10-CM | POA: Insufficient documentation

## 2023-09-20 DIAGNOSIS — I2510 Atherosclerotic heart disease of native coronary artery without angina pectoris: Secondary | ICD-10-CM

## 2023-09-20 DIAGNOSIS — Z01810 Encounter for preprocedural cardiovascular examination: Secondary | ICD-10-CM | POA: Insufficient documentation

## 2023-09-20 LAB — COMPREHENSIVE METABOLIC PANEL
ALT: 15 U/L (ref 0–53)
AST: 17 U/L (ref 0–37)
Albumin: 4.4 g/dL (ref 3.5–5.2)
Alkaline Phosphatase: 56 U/L (ref 39–117)
BUN: 13 mg/dL (ref 6–23)
CO2: 25 meq/L (ref 19–32)
Calcium: 9.3 mg/dL (ref 8.4–10.5)
Chloride: 103 meq/L (ref 96–112)
Creatinine, Ser: 0.97 mg/dL (ref 0.40–1.50)
GFR: 79.1 mL/min (ref >60.00)
Glucose, Bld: 85 mg/dL (ref 70–99)
Potassium: 4.3 meq/L (ref 3.5–5.1)
Sodium: 136 meq/L (ref 135–145)
Total Bilirubin: 0.6 mg/dL (ref 0.2–1.2)
Total Protein: 7 g/dL (ref 6.0–8.3)

## 2023-09-20 MED ORDER — ROSUVASTATIN CALCIUM 5 MG PO TABS: 5.0000 mg | ORAL_TABLET | Freq: Every day | ORAL | 3 refills | Status: DC

## 2023-09-20 NOTE — Assessment & Plan Note (Addendum)
 Well-controlled. Target below 120 over 80 mmHg he appears to have symptoms with systolic blood pressure in 130s pertaining to headache and pounding in the ears.  Continue to monitor blood pressure logs at home. If blood pressures consistently around 130s, would recommend adding further antihypertensive medication.  Will defer this to his PCP.  Is currently on perindopril  4 mg once daily. If further needed consider amlodipine 2.5 mg once daily to optimize blood pressure readings if not at target.

## 2023-09-20 NOTE — Progress Notes (Signed)
 Cardiology Consultation:    Date:  09/20/2023   ID:  DAMARI SUASTEGUI, DOB 1950/10/18, MRN [id number]  PCP:  Dorina Loving, PA-C  Cardiologist:  Alean SAUNDERS Astha Probasco, MD   Referring MD: Saguier, Edward, PA-C   No chief complaint on file.    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   Problem List Items Addressed This Visit     Mixed hyperlipidemia   Lipid Panel     Component Value Date/Time   CHOL 186 09/16/2023 1011   TRIG 159.0 (H) 09/16/2023 1011   HDL 31.80 (L) 09/16/2023 1011   CHOLHDL 6 09/16/2023 1011   VLDL 31.8 09/16/2023 1011   LDLCALC 122 (H) 09/16/2023 1011   Given coronary atherosclerosis would recommend targeting LDL below 70 mg/dL. Reviewed statins for this purpose. Discussed potential side effects of muscle aches, GI intolerance, etc.  He is agreeable to proceed.  Will prescribe Crestor  5 mg once daily.  Recommended blood work with CMP tentatively in 4 weeks to assess liver functions while on statins.       Relevant Medications   rosuvastatin  (CRESTOR ) 5 MG tablet   Hypertension   Well-controlled. Target below 120 over 80 mmHg he appears to have symptoms with systolic blood pressure in 130s pertaining to headache and pounding in the ears.  Continue to monitor blood pressure logs at home. If blood pressures consistently around 130s, would recommend adding further  antihypertensive medication.  Will defer this to his PCP.  Is currently on perindopril  4 mg once daily. If further needed consider amlodipine 2.5 mg once daily to optimize blood pressure readings if not at target.        Relevant Medications   rosuvastatin  (CRESTOR ) 5 MG tablet   Other Relevant Orders   ECHOCARDIOGRAM STRESS TEST   ECHOCARDIOGRAM COMPLETE   Coronary artery disease   Coronary atherosclerosis noted on CT abdomen from January 2025. He has not had any prior history of MI, CVA. He does have cardiovascular risk factors in form of age, prior smoking history, hypertension.  In this context we discussed potential symptoms of coronary artery disease. His palpitations, pounding sensation in the head do not appear to be anginal.  Further risk reduction strategies discussed.  -Aspirin 81 mg once daily for risk reduction discussed and he is agreeable.  Potential side effects reviewed. - Lipid-lowering therapy with statins for overall cardiovascular risk reduction discussed.  He is agreeable. Will start Crestor  5 mg once daily.   For further restratification given the existing coronary atherosclerosis we reviewed options for noninvasive testing with treadmill stress echocardiogram to assess functional capacity and to guide exertional activity recommendations.  He is agreeable will prescribe for treadmill stress echocardiogram.       Relevant Medications   rosuvastatin  (CRESTOR ) 5 MG tablet   Other Relevant Orders   VAS US  CAROTID   ECHOCARDIOGRAM STRESS TEST   Comprehensive metabolic panel   Preop cardiovascular exam - Primary   He does not have any active cardiac symptoms at this time  to suggest angina or heart failure. Incidental finding of coronary atherosclerosis on CT abdomen. He has good functional status at baseline.  Appears to attain more than 4 METS activity based on his functional capacity.  From cardiac standpoint okay to proceed with his elective  colonoscopy as being planned. Recommended he can hold off starting aspirin 81 mg once daily, until his colonoscopy is completed.         Relevant Orders   EKG 12-Lead (Completed)   Palpitations   The symptoms he described appear to be more of a throbbing sensation with slightly elevated systolic blood pressures compared to his baseline. These occur infrequently and not associated with any other cardiac symptoms.  Advised close monitoring of his blood pressures and titrating up antihypertensive regimen if indicated.  He also had carotid atherosclerosis without hemodynamically significant stenosis on prior ultrasound from April 2017. Will repeat ultrasound carotids to rule out any significant progression given his ongoing symptoms as described above.       Relevant Orders   ECHOCARDIOGRAM COMPLETE   Return to clinic based on test results.   History of Present Illness:    NOELLE HOOGLAND is a 73 y.o. male who is being seen today for the evaluation of coronary atherosclerosis noted on CT scan recently, palpitations, preop evaluation prior to scheduled colonoscopy at the request of Saguier, [place], PA-C.   Has history of diverticulosis and recent diverticulitis episode, right flank spigelian hernia, nonobstructive renal stones, hypothyroidism, hypertension, former smoker [quit over 45 years ago] incidental finding of coronary atherosclerosis on CT abdomen recently, first-degree AV block.  Reportedly for atypical chest pain back in April 2017 he underwent workup with treadmill stress test that was normal.  Has family history of colon cancer and undergoes colonoscopy every 5 years.  Pleasant gentleman here for the visit accompanied by his wife.  Previously drive truck until retirement.  Does not exercise routinely but keeps himself busy at home without any significant limitations.  Works in the yard in an indoors.  No complaints of chest pain or shortness of breath.  Reports has had  instances of throbbing sensation in the head and chest associated with somewhat elevated blood pressures which for him is in systolic 130s.  Home log of blood pressure readings since December noted typical readings ranging between 110s to 120s systolic and at times into 130s.  His symptoms appear to correlate with systolic blood pressures in 130s.  Denies any syncopal episodes.  Does note sensation of pounding heartbeat into both the ears and the head which occurs intermittently.  Few days ago this occurred for 30 minutes.  This was preceded by physically exertional work outdoors.  Has had short instances of this sensation occurred couple more times since then.  Denies any lightheadedness, vision changes, hearing changes.  Has been dealing with symptoms of diverticulitis recently and was on ciprofloxacin  and metronidazole  recently.  Reduced oral intake and was on liquid diet, this was associated with hyponatremia.  His diet has since improved and is pending repeat blood work.  Denies any significant blood in urine or stools. Anticipating colonoscopy and is requiring preprocedural cardiac risk assessment in the setting of his recent symptoms and coronary calcifications noted on CT abdomen.  Mentions he is good with taking medications as prescribed.  He does report hesitancy to take prescribed medications due to side effects pertaining to his GI system. Has been taking perindopril  4 mg once daily consistently for blood pressure control. Has been on levothyroxine  for  years. Takes aspirin intermittently as needed.  Smoked for about 10 years 1 pack a day, quit over 45 years ago  Alcohol occasionally. No recreational drug use or illicit drug use.  EKG in the clinic today shows sinus rhythm heart rate 84/min, PR interval mildly prolonged to 12 ms, QRS duration 80 ms.  CT abdomen from 09-07-2023 reported lower chest notable for coronary artery calcifications.  Prior treadmill exercise stress test from  April 2017 reported 13.1 METS activity attaining 92% MPHR with normal blood pressure response and normal stress test without ischemia.  Prior ultrasound carotids from April 2017 minimal to moderate amount of bilateral carotid atherosclerosis without any s hemodynamic significant stenosis.  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   Anxiety    Colon polyp    Coronary artery disease    Diverticulosis    Essential hypertension 08/24/2021   Excessive gas 05/28/2019   Gallstones    Gastroesophageal reflux disease without esophagitis 01/13/2016   Hyperlipidemia    Hypertension    Hypothyroidism    Labral tear of shoulder, degenerative, 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   Mixed hyperlipidemia 08/07/2020   Ventral hernia without obstruction or gangrene 08/24/2021    Past Surgical History:  Procedure Laterality Date   APPENDECTOMY  1993   calcium  deposits removal Bilateral 09/2006 and 09/2015   off the eyelids   HERNIA REPAIR  1995,2003,2005,2006,2011   hernia repairs after each surgery    Current Medications: Current Meds  Medication Sig   ciprofloxacin  (CIPRO ) 500 MG tablet Take 1 tablet (500 mg total) by mouth 2 (two) times daily.   fluticasone  (FLONASE ) 50 MCG/ACT nasal spray Place 2 sprays into both nostrils daily.   levothyroxine  (SYNTHROID ) 150 MCG tablet Take 150 mcg by mouth daily before breakfast.   metroNIDAZOLE  (FLAGYL ) 500 MG tablet Take 1 tablet (500 mg total) by mouth 3 (three) times daily for 7 days.   perindopril  (ACEON ) 4 MG tablet Take 1 tablet (4 mg total) by mouth daily.   rosuvastatin  (CRESTOR ) 5 MG tablet Take 1 tablet (5 mg total) by mouth daily.     Allergies:   Patient has no known allergies.   Social History   Socioeconomic History   Marital status: Married    Spouse name: Not on file   Number of children: Not on file   Years of education: Not on file   Highest education level:  12th grade  Occupational History   Not on file  Tobacco Use   Smoking status: Former    Current packs/day: 0.00    Average packs/day: 0.5 packs/day for 6.0 years (3.0 ttl pk-yrs)    Types: Cigarettes    Start date: 08/20/1971    Quit date: 08/19/1977    Years since quitting: 46.1   Smokeless tobacco: Not on file  Vaping Use   Vaping status: Never Used  Substance and Sexual Activity   Alcohol use: Yes    Comment: rare in summers.   Drug use: No   Sexual activity: Not on file  Other Topics Concern   Not on file  Social History Narrative   Not on file   Social Drivers of Health   Financial Resource Strain: Low Risk  (09/19/2023)   Overall Financial Resource Strain (CARDIA)    Difficulty of Paying Living Expenses: Not hard at all  Food Insecurity: No Food Insecurity (09/19/2023)   Hunger Vital Sign    Worried About Running  Out of Food in the Last Year: Never true    Ran Out of Food in the Last Year: Never true  Transportation Needs: No Transportation Needs (09/19/2023)   PRAPARE - Administrator, Civil Service (Medical): No    Lack of Transportation (Non-Medical): No  Physical Activity: Sufficiently Active (09/19/2023)   Exercise Vital Sign    Days of Exercise per Week: 4 days    Minutes of Exercise per Session: 150+ min  Stress: No Stress Concern Present (09/19/2023)   [employer] of Occupational Health - Occupational Stress Questionnaire    Feeling of Stress : Not at all  Social Connections: Moderately Isolated (09/19/2023)   Social Connection and Isolation Panel [NHANES]    Frequency of Communication with Friends and Family: More than three times a week    Frequency of Social Gatherings with Friends and Family: Three times a week    Attends Religious Services: Never    Active Member of Clubs or Organizations: No    Attends Banker Meetings: Never    Marital Status: Married     Family History: The patient's family history includes Cancer in his  brother; Colon cancer in his father; Heart disease in his brother. There is no history of Allergic rhinitis, Angioedema, Asthma, Eczema, Immunodeficiency, or Urticaria. ROS:   Please see the history of present illness.    All 14 point review of systems negative except as described per history of present illness.  EKGs/Labs/Other Studies Reviewed:    The following studies were reviewed today:   EKG:  EKG Interpretation Date/Time:  Tuesday September 20 2023 08:45:46 EST Ventricular Rate:  84 PR Interval:  212 QRS Duration:  80 QT Interval:  348 QTC Calculation: 411 R Axis:   5  Text Interpretation: Sinus rhythm with 1st degree A-V block When compared with ECG of 13-Aug-2022 15:53, PREVIOUS ECG IS PRESENT Confirmed by Liborio Hai reddy [phone]) on 09/20/2023 9:02:54 AM    Recent Labs: 07/28/2023: TSH 3.03 09/19/2023: ALT 15; BUN 15; Creatinine, Ser 0.96; Hemoglobin 16.5; Platelets 253.0; Potassium 3.9; Sodium 126  Recent Lipid Panel    Component Value Date/Time   CHOL 186 09/16/2023 1011   TRIG 159.0 (H) 09/16/2023 1011   HDL 31.80 (L) 09/16/2023 1011   CHOLHDL 6 09/16/2023 1011   VLDL 31.8 09/16/2023 1011   LDLCALC 122 (H) 09/16/2023 1011    Physical Exam:    VS:  BP 120/70   Pulse 84   Ht 6' 1 (1.854 m)   Wt 262 lb (118.8 kg)   SpO2 94%   BMI 34.57 kg/m     Wt Readings from Last 3 Encounters:  09/20/23 262 lb (118.8 kg)  09/19/23 262 lb (118.8 kg)  09/16/23 266 lb (120.7 kg)     GENERAL:  Well nourished, well developed in no acute distress NECK: No JVD; No carotid bruits CARDIAC: RRR, S1 and S2 present, no murmurs, no rubs, no gallops CHEST:  Clear to auscultation without rales, wheezing or rhonchi  Extremities: No pitting pedal edema. Pulses bilaterally symmetric with radial 2+ and dorsalis pedis 2+ NEUROLOGIC:  Alert and oriented x 3  Medication Adjustments/Labs and Tests Ordered: Current medicines are reviewed at length with the patient today.  Concerns  regarding medicines are outlined above.  Orders Placed This Encounter  Procedures   Comprehensive metabolic panel   EKG 12-Lead   ECHOCARDIOGRAM STRESS TEST   ECHOCARDIOGRAM COMPLETE   VAS US  CAROTID   Meds ordered this encounter  Medications   rosuvastatin  (CRESTOR ) 5 MG tablet    Sig: Take 1 tablet (5 mg total) by mouth daily.    Dispense:  90 tablet    Refill:  3    Signed, Teo Moede reddy Machel Violante, MD, MPH, [facility]. 09/20/2023 9:47 AM    [facility]

## 2023-09-20 NOTE — Assessment & Plan Note (Signed)
 He does not have any active cardiac symptoms at this time to suggest angina or heart failure. Incidental finding of coronary atherosclerosis on CT abdomen. He has good functional status at baseline.  Appears to attain more than 4 METS activity based on his functional capacity.  From cardiac standpoint okay to proceed with his elective colonoscopy as being planned. Recommended he can hold off starting aspirin 81 mg once daily, until his colonoscopy is completed.

## 2023-09-20 NOTE — Assessment & Plan Note (Signed)
 Lipid Panel     Component Value Date/Time   CHOL 186 09/16/2023 1011   TRIG 159.0 (H) 09/16/2023 1011   HDL 31.80 (L) 09/16/2023 1011   CHOLHDL 6 09/16/2023 1011   VLDL 31.8 09/16/2023 1011   LDLCALC 122 (H) 09/16/2023 1011   Given coronary atherosclerosis would recommend targeting LDL below 70 mg/dL. Reviewed statins for this purpose. Discussed potential side effects of muscle aches, GI intolerance, etc.  He is agreeable to proceed.  Will prescribe Crestor  5 mg once daily.  Recommended blood work with CMP tentatively in 4 weeks to assess liver functions while on statins.

## 2023-09-20 NOTE — Patient Instructions (Signed)
 Medication Instructions:    Start taking Crestor  5 mg once a day.  *If you need a refill on your cardiac medications before your next appointment, please call your pharmacy*   Lab Work: Your physician recommends that you return for lab work in: 4 weeks At [employer] [address]  You need to have labs done when you are fasting.  You can come Monday through Friday 8:00 am to 11:30 am  and 1:00 pm to 4:00. You do not need to make an appointment as the order has already been placed. The labs you are going to have done are CMP.   If you have labs (blood work) drawn today and your tests are completely normal, you will receive your results only by: MyChart Message (if you have MyChart) OR A paper copy in the mail If you have any lab test that is abnormal or we need to change your treatment, we will call you to review the results.   Testing/Procedures: Echocardiogram An echocardiogram is a test that uses sound waves (ultrasound) to produce images of the heart. Images from an echocardiogram can provide important information about: Heart size and shape. The size and thickness and movement of your heart's walls. Heart muscle function and strength. Heart valve function or if you have stenosis. Stenosis is when the heart valves are too narrow. If blood is flowing backward through the heart valves (regurgitation). A tumor or infectious growth around the heart valves. Areas of heart muscle that are not working well because of poor blood flow or injury from a heart attack. Aneurysm detection. An aneurysm is a weak or damaged part of an artery wall. The wall bulges out from the normal force of blood pumping through the body. Tell a health care provider about: Any allergies you have. All medicines you are taking, including vitamins, herbs, eye drops, creams, and over-the-counter medicines. Any blood disorders you have. Any surgeries you have had. Any medical conditions you have. Whether  you are pregnant or may be pregnant. What are the risks? Generally, this is a safe test. However, problems may occur, including an allergic reaction to dye (contrast) that may be used during the test. What happens before the test? No specific preparation is needed. You may eat and drink normally. What happens during the test?  You will take off your clothes from the waist up and put on a hospital gown. Electrodes or electrocardiogram (ECG)patches may be placed on your chest. The electrodes or patches are then connected to a device that monitors your heart rate and rhythm. You will lie down on a table for an ultrasound exam. A gel will be applied to your chest to help sound waves pass through your skin. A handheld device, called a transducer, will be pressed against your chest and moved over your heart. The transducer produces sound waves that travel to your heart and bounce back (or echo back) to the transducer. These sound waves will be captured in real-time and changed into images of your heart that can be viewed on a video monitor. The images will be recorded on a computer and reviewed by your health care provider. You may be asked to change positions or hold your breath for a short time. This makes it easier to get different views or better views of your heart. In some cases, you may receive contrast through an IV in one of your veins. This can improve the quality of the pictures from your heart. The procedure may  vary among health care providers and hospitals. What can I expect after the test? You may return to your normal, everyday life, including diet, activities, and medicines, unless your health care provider tells you not to do that. Follow these instructions at home: It is up to you to get the results of your test. Ask your health care provider, or the department that is doing the test, when your results will be ready. Keep all follow-up visits. This is important. Summary An  echocardiogram is a test that uses sound waves (ultrasound) to produce images of the heart. Images from an echocardiogram can provide important information about the size and shape of your heart, heart muscle function, heart valve function, and other possible heart problems. You do not need to do anything to prepare before this test. You may eat and drink normally. After the echocardiogram is completed, you may return to your normal, everyday life, unless your health care provider tells you not to do that. This information is not intended to replace advice given to you by your health care provider. Make sure you discuss any questions you have with your health care provider. Document Revised: 04/15/2021 Document Reviewed: 03/25/2020 Elsevier Patient Education  2023 [employer]  Your physician has requested that you have a carotid duplex. This test is an ultrasound of the carotid arteries in your neck. It looks at blood flow through these arteries that supply the brain with blood. Allow one hour for this exam. There are no restrictions or special instructions.     Stress Echocardiogram Instructions:    1. You may take all of your medications  2. No food, drink or tobacco products four hours prior to your test.  3. Dress prepared to exercise. Best to wear 2 piece outfit and tennis shoes. Shoes must be closed toe.  4. Please bring all current prescription medications.    Follow-Up: At [facility], you and your health needs are our priority.  As part of our continuing mission to provide you with exceptional heart care, we have created designated Provider Care Teams.  These Care Teams include your primary Cardiologist (physician) and Advanced Practice Providers (APPs -  Physician Assistants and Nurse Practitioners) who all work together to provide you with the care you need, when you need it.  We recommend signing up for the patient portal called MyChart.  Sign up information is provided  on this After Visit Summary.  MyChart is used to connect with patients for Virtual Visits (Telemedicine).  Patients are able to view lab/test results, encounter notes, upcoming appointments, etc.  Non-urgent messages can be sent to your provider as well.   To learn more about what you can do with MyChart, go to [email or web address].    Your next appointment:   Follow up based on test results    Other Instructions Exercise Stress Echocardiogram An exercise stress echocardiogram is a test to check how well your heart is working. This test uses sound waves and a computer to make pictures of your heart. These pictures will be taken before and after you exercise. For this test, you will walk on a treadmill or ride a bicycle to make your heart beat faster. While you exercise, your heart will be checked with an electrocardiogram (ECG). Your blood pressure will also be checked. You may have this test if: You have chest pain or a heart problem. You had a heart attack or heart surgery not long ago. You have heart valve problems. You have a condition  that causes narrowing of the blood vessels that supply your heart. You have a high risk of heart disease and: You are starting a new exercise program. You need to have a big surgery. Tell a doctor about: Any allergies you have. All medicines you are taking. This includes vitamins, herbs, eye drops, creams, and over-the-counter medicines. Any problems you or family members have had with medicines that make you fall asleep (anesthetic medicines). Any surgeries you have had. Any blood disorders you have. Any medical conditions you have. Whether you are pregnant or may be pregnant. What are the risks? Generally, this is a safe test. However, problems may occur, including: Chest pain. Feeling dizzy or light-headed. Shortness of breath. Increased or irregular heartbeat. Feeling like you may vomit (nausea) or vomiting. Heart attack. This is very  rare. What happens before the test? Medicines Ask your doctor about changing or stopping your normal medicines. This is important if you take diabetes medicines or blood thinners. If you use an inhaler, bring it to the test. General instructions Wear comfortable clothes and walking shoes. Follow instructions from your doctor about what you cannot eat or drink before the test. Do not drink or eat anything that has caffeine in it. Stop having caffeine 24 hours before the test. Do not smoke or use products that contain nicotine or tobacco for 4 hours before the test. If you need help quitting, ask your doctor. What happens during the test?  You will take off your clothes from the waist up and put on a hospital gown. Electrodes or patches will be put on your chest. A blood pressure cuff will be put on your arm. Before you exercise, a computer will make a picture of your heart. To do this: You will lie down and a gel will be put on your chest. A wand will be moved over the gel. Sound waves from the wand will go to the computer to make the picture. Then, you will start to exercise. You may walk on a treadmill or pedal a bicycle. Your blood pressure and heart rhythm will be checked while you exercise. The exercise will get harder or faster. You will exercise until: Your heart reaches a certain level. You are too tired to go on. You cannot go on because of chest pain, weakness, or dizziness. You will lie down right away so another picture of your heart can be taken. The procedure may vary among doctors and hospitals. What can I expect after the test? After your test, it is common to have: Mild soreness. Mild tiredness. Your heart rate and blood pressure will be checked until they return to your normal levels. You should not have any new symptoms after this test. Follow these instructions at home: If your doctor says that you can, you may: Eat what you normally eat. Do your normal  activities. Take over-the-counter and prescription medicines only as told by your doctor. Keep all follow-up visits. It is up to you to get the results of your test. Ask how to get your results when they are ready. Contact a doctor if: You feel dizzy or light-headed. You have a fast or irregular heartbeat. You feel like you may vomit or you vomit. You have a headache. You feel short of breath. Get help right away if: You develop pain or pressure: In your chest. In your jaw or neck. Between your shoulders. That goes down your left arm. You faint. You have trouble breathing. These symptoms may be an emergency.  Get medical help right away. Call your local emergency services (911 in the [place] Do not wait to see if the symptoms will go away. Do not drive yourself to the hospital. Summary This is a test that checks how well your heart is working. Follow instructions about what you cannot eat or drink before the test. Ask your doctor if you should take your normal medicines before the test. Stop having caffeine 24 hours before the test. Do not smoke or use products with nicotine or tobacco in them for 4 hours before the test. During the test, your blood pressure and heart rhythm will be checked while you exercise. This information is not intended to replace advice given to you by your health care provider. Make sure you discuss any questions you have with your health care provider. Document Revised: 04/15/2021 Document Reviewed: 03/25/2020 Elsevier Patient Education  2022 [employer].

## 2023-09-20 NOTE — Assessment & Plan Note (Addendum)
 The symptoms he described appear to be more of a throbbing sensation with slightly elevated systolic blood pressures compared to his baseline. These occur infrequently and not associated with any other cardiac symptoms.  Advised close monitoring of his blood pressures and titrating up antihypertensive regimen if indicated.  He also had carotid atherosclerosis without hemodynamically significant stenosis on prior ultrasound from April 2017. Will repeat ultrasound carotids to rule out any significant progression given his ongoing symptoms as described above.

## 2023-09-20 NOTE — Assessment & Plan Note (Signed)
 Coronary atherosclerosis noted on CT abdomen from January 2025. He has not had any prior history of MI, CVA. He does have cardiovascular risk factors in form of age, prior smoking history, hypertension.  In this context we discussed potential symptoms of coronary artery disease. His palpitations, pounding sensation in the head do not appear to be anginal.  Further risk reduction strategies discussed.  -Aspirin 81 mg once daily for risk reduction discussed and he is agreeable.  Potential side effects reviewed. - Lipid-lowering therapy with statins for overall cardiovascular risk reduction discussed.  He is agreeable. Will start Crestor  5 mg once daily.   For further restratification given the existing coronary atherosclerosis we reviewed options for noninvasive testing with treadmill stress echocardiogram to assess functional capacity and to guide exertional activity recommendations.  He is agreeable will prescribe for treadmill stress echocardiogram.

## 2023-09-20 NOTE — Telephone Encounter (Signed)
Marchelle Folks,  I just saw pt recently for mild llq that may have been early diverticulitis. Wanted you to be aware as I know you are in process of scheduling him for colonoscopy. Not sure how this would effect timing/scheduling.  Esperanza Richters, PA-C

## 2023-09-22 ENCOUNTER — Other Ambulatory Visit: Payer: Self-pay

## 2023-09-22 DIAGNOSIS — Z01810 Encounter for preprocedural cardiovascular examination: Secondary | ICD-10-CM

## 2023-09-22 DIAGNOSIS — I10 Essential (primary) hypertension: Secondary | ICD-10-CM

## 2023-09-22 DIAGNOSIS — R002 Palpitations: Secondary | ICD-10-CM

## 2023-09-22 DIAGNOSIS — I6523 Occlusion and stenosis of bilateral carotid arteries: Secondary | ICD-10-CM

## 2023-09-22 DIAGNOSIS — I2510 Atherosclerotic heart disease of native coronary artery without angina pectoris: Secondary | ICD-10-CM

## 2023-09-22 DIAGNOSIS — E782 Mixed hyperlipidemia: Secondary | ICD-10-CM

## 2023-09-29 ENCOUNTER — Telehealth (HOSPITAL_COMMUNITY): Payer: Self-pay | Admitting: *Deleted

## 2023-09-29 NOTE — Telephone Encounter (Signed)
Patient given detailed instructions per Stress Test Requisition Sheet for test on 10/06/23 at 1:45.Patient Notified to arrive 30 minutes early, and that it is imperative to arrive on time for appointment to keep from having the test rescheduled.  Patient verbalized understanding. Daneil Dolin

## 2023-10-06 ENCOUNTER — Other Ambulatory Visit: Payer: Medicare Other

## 2023-10-07 ENCOUNTER — Ambulatory Visit (HOSPITAL_BASED_OUTPATIENT_CLINIC_OR_DEPARTMENT_OTHER)
Admission: RE | Admit: 2023-10-07 | Discharge: 2023-10-07 | Disposition: A | Discharge status: Home / Self Care | Payer: Medicare Other | Source: Ambulatory Visit

## 2023-10-07 DIAGNOSIS — I2510 Atherosclerotic heart disease of native coronary artery without angina pectoris: Secondary | ICD-10-CM | POA: Insufficient documentation

## 2023-10-07 DIAGNOSIS — Z01810 Encounter for preprocedural cardiovascular examination: Secondary | ICD-10-CM | POA: Diagnosis not present

## 2023-10-07 DIAGNOSIS — I6523 Occlusion and stenosis of bilateral carotid arteries: Secondary | ICD-10-CM | POA: Diagnosis not present

## 2023-10-07 LAB — ECHOCARDIOGRAM COMPLETE
AR max vel: 3.57 cm2
AV Area VTI: 3.86 cm2
AV Area mean vel: 3.62 cm2
AV Mean grad: 4 mm[Hg]
AV Peak grad: 7.1 mm[Hg]
AV Vena cont: 0.2 cm
Ao pk vel: 1.33 m/s
Area-P 1/2: 2.43 cm2
Calc EF: 63.7 %
P 1/2 time: 2242 ms
S' Lateral: 2.4 cm
Single Plane A2C EF: 66.9 %
Single Plane A4C EF: 64.3 %

## 2023-10-12 ENCOUNTER — Ambulatory Visit (INDEPENDENT_AMBULATORY_CARE_PROVIDER_SITE_OTHER): Payer: Medicare Other

## 2023-10-12 ENCOUNTER — Ambulatory Visit: Payer: Medicare Other | Attending: Cardiology

## 2023-10-12 DIAGNOSIS — I10 Essential (primary) hypertension: Secondary | ICD-10-CM

## 2023-10-12 DIAGNOSIS — I2510 Atherosclerotic heart disease of native coronary artery without angina pectoris: Secondary | ICD-10-CM | POA: Diagnosis not present

## 2023-10-13 ENCOUNTER — Other Ambulatory Visit (HOSPITAL_COMMUNITY): Payer: Medicare Other

## 2023-10-18 ENCOUNTER — Telehealth: Payer: Self-pay | Admitting: Emergency Medicine

## 2023-10-18 NOTE — Telephone Encounter (Signed)
-----   Message from Marlyn Corporal Madireddy sent at 10/18/2023 10:02 AM EST ----- I sent him a note regarding stress echocardiogram results.  Please schedule him for follow-up visit in 3 months.  Thank you

## 2023-10-18 NOTE — Telephone Encounter (Signed)
 Called patient and reviewed result note as per Dr. Anna Genre note. Reviewed that Dr. Vincent Gros would like for him to follow up in 3 months. Patient stated that he lives in [place] during the summer and he will follow up with a cardiologist there, if he has any problems. Told patient that I would inform Dr. Vincent Gros of this. Patient verbalized understanding and had no further questions.

## 2023-10-20 NOTE — Progress Notes (Signed)
 [facility] Return Visit   Referring Provider Marisue Brooklyn [address] [place],  [place] [address]  Primary Care Provider Saguier, Ramon Dredge, PA-C  Patient Profile: Timothy Anthony is a 73 y.o. male with a past medical history noteworthy for HTN, HLD, CAD, first-degree AV block, hypothyroidism, obesity, recurrent right ventral hernia who returns to the [facility] for follow-up of the problem(s) noted below.  Problem List: Chronic RLQ abdominal pain Change in bowel habits, bloating Diverticulosis with suspected diverticulitis 09/2023 History of recurrent right ventral hernia Personal history of colon polyps Family history of colorectal cancer in a first-degree relative-father age 8 Cholelithiasis status postcholecystectomy  History of Present Illness   Timothy Anthony was last seen in the GI office 124/2025 by Quentin Mulling, PA   Current GI Meds  None  Interval History   Diverticulosis with suspected diverticulitis 09/2023 -- Subsequent to last clinic visit developed left lower quadrant abdominal pain, nausea and diarrhea -- PCP suspected diverticulitis given CTAP 09/07/2023 showing diverticulosis -- Prescribed Cipro and Flagyl-states that he did not tolerate Cipro and decrease Flagyl from 3 times daily dosing to twice daily dosing -- Reports symptoms responded to Flagyl twice daily and is now asymptomatic -- States that this is his first episode of suspected diverticulitis -- At last visit was being evaluated for pending colonoscopy -discussed that recent episode of diverticulitis is also an indication for colonoscopy  Bloating/previous change in bowel habits -- When he was last seen was endorsing gas, bloating and loose stools--> advised to trial a low FODMAP diet -- In light of recent episode of diverticulitis has been modifying diet and limiting some forms of fiber and meat -- Feels that gas, bloating and looser stools have  improved since abx treatment for diverticulitis --> discussed that antibiotics could have treated a component of SIBO -- Discussed use of probiotics for symptoms of gas and bloating  Personal history of colon polyps and family history of colon cancer -- Last colonoscopy performed in 2019 with report unavailable -- Patient states that he has had polyps in the past -- Father diagnosed with colorectal cancer at age 67 -- Brother has had colon polyps -- Scheduling colonoscopy was deferred at last visit due to patient reporting palpitations and pending cardiology evaluation --> cardiology has cleared him for colonoscopy  Chronic right lower quadrant abdominal pain -- Has a history of chronic RLQ abdominal pain likely related to history of hernia surgery status post multiple repairs -- CTAP 08/2023 showed stable fat-containing spigelian hernia  Last colonoscopy:  03/2018 -report not available Last endoscopy: None  Last Abd CT/CTE/MRE: 08/2023 -stable fat-containing spigelian hernia, diverticulosis without diverticulitis, nonobstructive renal calculi, CAD  GI Review of Symptoms Significant for intermittent RLQ abdpain. Otherwise negative.  General Review of Systems  Review of systems is significant for the pertinent positives and negatives as listed per the HPI.  Full ROS is otherwise negative.  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   Anxiety    Colon polyp    Coronary artery disease    Diverticulosis    Essential hypertension 08/24/2021   Excessive gas 05/28/2019   Gallstones    Gastroesophageal reflux disease without esophagitis 01/13/2016   Hyperlipidemia    Hypertension    Hypothyroidism    Labral tear of shoulder, degenerative, 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   Mixed hyperlipidemia 08/07/2020   Ventral hernia  without obstruction or gangrene 08/24/2021     Past Surgical History    Past Surgical History:  Procedure Laterality Date   APPENDECTOMY  1993   calcium deposits removal Bilateral 09/2006 and 09/2015   off the eyelids   HERNIA REPAIR  1995,2003,2005,2006,2011   hernia repairs after each surgery     Allergies and Medications  No Known Allergies   Current Meds  Medication Sig   levothyroxine (SYNTHROID) 150 MCG tablet Take 150 mcg by mouth daily before breakfast.   Na Sulfate-K Sulfate-Mg Sulfate concentrate (SUPREP) 17.5-3.13-1.6 GM/177ML SOLN Use as directed; may use generic; goodrx card if insurance will not cover generic   perindopril (ACEON) 4 MG tablet Take 1 tablet (4 mg total) by mouth daily.   rosuvastatin (CRESTOR) 5 MG tablet Take 1 tablet (5 mg total) by mouth daily.     Family History   Family History  Problem Relation Age of Onset   Colon cancer Father    Cancer Brother    Heart disease Brother    Allergic rhinitis Neg Hx    Angioedema Neg Hx    Asthma Neg Hx    Eczema Neg Hx    Immunodeficiency Neg Hx    Urticaria Neg Hx    Brother-colon polyps  Social History   Social History   Tobacco Use   Smoking status: Former    Current packs/day: 0.00    Average packs/day: 0.5 packs/day for 6.0 years (3.0 ttl pk-yrs)    Types: Cigarettes    Start date: 08/20/1971    Quit date: 08/19/1977    Years since quitting: 46.2  Vaping Use   Vaping status: Never Used  Substance Use Topics   Alcohol use: Yes    Comment: rare in summers.   Drug use: No   Timothy Anthony reports that he quit smoking about 46 years ago. His smoking use included cigarettes. He started smoking about 52 years ago. He has a 3 pack-year smoking history. He does not have any smokeless tobacco history on file. He reports current alcohol use. He reports that he does not use drugs.  Vital Signs and Physical Examination   Vitals:   10/21/23 1452  BP: 122/72  Pulse: 68   Body mass index is 33.91 kg/m. Weight: 257 lb (116.6 kg)  General: Well developed, well  nourished, no acute distress Head: Normocephalic and atraumatic Eyes: Sclerae anicteric, EOMI Lungs: Clear throughout to auscultation Heart: Regular rate and rhythm; No murmurs, rubs or bruits Abdomen: Soft, mild tenderness RLQ at hernia site - no tenderness in LLQ and non distended. No masses, hepatosplenomegaly or hernias noted. Normal Bowel sounds Rectal: Deferred Musculoskeletal: Symmetrical with no gross deformities    Review of Data  The following data was reviewed at the time of this encounter:  Laboratory Studies      Latest Ref Rng & Units 09/19/2023   11:50 AM 08/24/2023    3:15 PM 07/28/2023   10:31 AM  CBC  WBC 4.0 - 10.5 K/uL 6.9  6.8  5.6   Hemoglobin 13.0 - 17.0 g/dL 21.3  08.6  57.8   Hematocrit 39.0 - 52.0 % 47.2  47.8  48.0   Platelets 150.0 - 400.0 K/uL 253.0  262.0  229.0     Lab Results  Component Value Date   LIPASE 32.0 08/24/2023      Latest Ref Rng & Units 09/20/2023    9:47 AM 09/19/2023   11:50 AM 08/24/2023    3:15 PM  CMP  Glucose 70 - 99 mg/dL 85  66  91   BUN 6 - 23 mg/dL 13  15  12    Creatinine 0.40 - 1.50 mg/dL 6.04  5.40  9.81   Sodium 135 - 145 mEq/L 136  126  137   Potassium 3.5 - 5.1 mEq/L 4.3  3.9  4.2   Chloride 96 - 112 mEq/L 103  108  101   CO2 19 - 32 mEq/L 25  24  31    Calcium 8.4 - 10.5 mg/dL 9.3  9.1  9.4   Total Protein 6.0 - 8.3 g/dL 7.0  6.9  7.0   Total Bilirubin 0.2 - 1.2 mg/dL 0.6  1.3  0.8   Alkaline Phos 39 - 117 U/L 56  54  52   AST 0 - 37 U/L 17  16  18    ALT 0 - 53 U/L 15  15  22     Lab Results  Component Value Date   ESRSEDRATE 6 09/16/2023   Imaging Studies  CTAP 09/07/2023 1. Appearance of the right lower quadrant abdominal wall is unchanged, with evidence of prior hernia repair, and a small, fat containing spigelian type hernia. 2. Small nonobstructive calculus of the midportion of the left kidney. No right-sided calculi, ureteral calculi, or hydronephrosis. 3. Sigmoid diverticulosis without evidence of  acute diverticulitis. 4. Coronary artery disease.  GI Procedures and Studies  Patient and previous record review suggest colonoscopies in 2013 and 2019 --> formal records not available for review Records indicate patient has had a polyp(s) in the past without reports available for review   Clinical Impression  It is my clinical impression that Timothy Anthony is a 73 y.o. male with;  Chronic RLQ abdominal pain Change in bowel habits, bloating Diverticulosis with suspected diverticulitis 09/2023 History of recurrent right ventral hernia Personal history of colon polyps Family history of colorectal cancer in a first-degree relative-father age 73 Cholelithiasis status postcholecystectomy  Timothy Anthony presents to follow-up for multiple gastrointestinal issues as outlined above.  Shortly after his last visit he developed left lower quadrant abdominal pain that his PCP suspected could be early diverticulitis based upon recent CT 08/2023 showing sigmoid diverticulosis.  He was treated with ciprofloxacin and Flagyl.  States he did not tolerate Cipro so he only continued Flagyl but did have resolution of abdominal pain.  Incidentally, he noted that his other change in bowel habits with bloating and looser stools also improved.  Discussed that in conjunction with diverticulitis he may have also had SIBO contributing to his symptomatology.    At his last visit he was being evaluated for surveillance colonoscopy for a history of colon polyps.  He was cleared by cardiology to proceed.  We discussed that in addition to being due for surveillance, his recent episode of diverticulitis is also an indication for colonoscopy.  Reviewed that this would be scheduled at least 6 to 8 weeks from the episode which began the first week of February 2025.  He notes that he has travel plans beginning in early April and will be gone for an extended period of time.  He requests that his colonoscopy be scheduled the last 2 weeks of March  2025.   Plan  Schedule colonoscopy at [facility] for colon polyp surveillance and recent episode of diverticulitis Reviewed use of probiotics for gas and bloating -Culturelle, Florastor, align. If there is concern for SIBO in the future could consider another course of metronidazole Continue dietary modification with slow reintroduction of fibrous foods and  cross-referencing with low FODMAP diet or modified low FODMAP diet Previously referred to general surgery for evaluation of RLQ hernia  Planned Follow Up 6 months  The patient or caregiver verbalized understanding of the material covered, with no barriers to understanding. All questions were answered. Patient or caregiver is agreeable with the plan outlined above.    It was a pleasure to see Timothy Anthony.  If you have any questions or concerns regarding this evaluation, do not hesitate to contact me.  [name], MD [facility]   I spent total of 30 minutes in both face-to-face and non-face-to-face activities, excluding procedures performed, for the visit on the date of this encounter.

## 2023-10-21 ENCOUNTER — Encounter: Payer: Self-pay | Admitting: Pediatrics

## 2023-10-21 ENCOUNTER — Ambulatory Visit: Payer: Medicare Other | Admitting: Pediatrics

## 2023-10-21 VITALS — BP 122/72 | HR 68 | Ht 73.0 in | Wt 257.0 lb

## 2023-10-21 DIAGNOSIS — R140 Abdominal distension (gaseous): Secondary | ICD-10-CM | POA: Diagnosis not present

## 2023-10-21 DIAGNOSIS — R1031 Right lower quadrant pain: Secondary | ICD-10-CM | POA: Diagnosis not present

## 2023-10-21 DIAGNOSIS — K5732 Diverticulitis of large intestine without perforation or abscess without bleeding: Secondary | ICD-10-CM

## 2023-10-21 DIAGNOSIS — Z800 Family history of malignant neoplasm of digestive organs: Secondary | ICD-10-CM

## 2023-10-21 DIAGNOSIS — K5790 Diverticulosis of intestine, part unspecified, without perforation or abscess without bleeding: Secondary | ICD-10-CM

## 2023-10-21 DIAGNOSIS — Z860100 Personal history of colon polyps, unspecified: Secondary | ICD-10-CM

## 2023-10-21 DIAGNOSIS — R195 Other fecal abnormalities: Secondary | ICD-10-CM

## 2023-10-21 DIAGNOSIS — K5730 Diverticulosis of large intestine without perforation or abscess without bleeding: Secondary | ICD-10-CM

## 2023-10-21 DIAGNOSIS — G8929 Other chronic pain: Secondary | ICD-10-CM

## 2023-10-21 MED ORDER — NA SULFATE-K SULFATE-MG SULF 17.5-3.13-1.6 GM/177ML PO SOLN: ORAL | 0 refills | Status: DC

## 2023-10-21 NOTE — Patient Instructions (Signed)
 You have been scheduled for a colonoscopy. Please follow written instructions given to you at your visit today.   If you use inhalers (even only as needed), please bring them with you on the day of your procedure.  DO NOT TAKE 7 DAYS PRIOR TO TEST- Trulicity (dulaglutide) Ozempic, Wegovy (semaglutide) Mounjaro (tirzepatide) Bydureon Bcise (exanatide extended release)  DO NOT TAKE 1 DAY PRIOR TO YOUR TEST Rybelsus (semaglutide) Adlyxin (lixisenatide) Victoza (liraglutide) Byetta (exanatide)  We have sent the following medications to your pharmacy for you to pick up at your convenience: Suprep  _______________________________________________________  If your blood pressure at your visit was 140/90 or greater, please contact your primary care physician to follow up on this.  _______________________________________________________  If you are age 71 or older, your body mass index should be between 23-30. Your Body mass index is 33.91 kg/m. If this is out of the aforementioned range listed, please consider follow up with your Primary Care Provider.  If you are age 71 or younger, your body mass index should be between 19-25. Your Body mass index is 33.91 kg/m. If this is out of the aformentioned range listed, please consider follow up with your Primary Care Provider.   ________________________________________________________  The [facility] providers would like to encourage you to use [facility] to communicate with providers for non-urgent requests or questions.  Due to long hold times on the telephone, sending your provider a message by [facility] may be a faster and more efficient way to get a response.  Please allow 48 business hours for a response.  Please remember that this is for non-urgent requests.  _______________________________________________________  Due to recent changes in healthcare laws, you may see the results of your imaging and laboratory studies on MyChart before your  provider has had a chance to review them.  We understand that in some cases there may be results that are confusing or concerning to you. Not all laboratory results come back in the same time frame and the provider may be waiting for multiple results in order to interpret others.  Please give [place] 48 hours in order for your provider to thoroughly review all the results before contacting the office for clarification of your results.   Thank you for entrusting me with your care and choosing [facility].  Dr Doy Hutching

## 2023-10-24 DIAGNOSIS — I2510 Atherosclerotic heart disease of native coronary artery without angina pectoris: Secondary | ICD-10-CM | POA: Diagnosis not present

## 2023-10-25 LAB — COMPREHENSIVE METABOLIC PANEL
ALT: 17 IU/L (ref 0–44)
AST: 20 IU/L (ref 0–40)
Albumin: 4.5 g/dL (ref 3.9–4.9)
Alkaline Phosphatase: 61 IU/L (ref 44–121)
BUN/Creatinine Ratio: 12 (ref 10–24)
BUN: 12 mg/dL (ref 8–27)
Bilirubin Total: 0.8 mg/dL (ref 0.0–1.2)
CO2: 24 mmol/L (ref 20–29)
Calcium: 9.4 mg/dL (ref 8.6–10.2)
Chloride: 105 mmol/L (ref 96–106)
Creatinine, Ser: 1 mg/dL (ref 0.76–1.27)
Globulin, Total: 2 g/dL (ref 1.5–4.5)
Glucose: 98 mg/dL (ref 70–99)
Potassium: 5 mmol/L (ref 3.5–5.2)
Sodium: 142 mmol/L (ref 134–144)
Total Protein: 6.5 g/dL (ref 6.0–8.5)
eGFR: 81 mL/min/{1.73_m2} (ref >59)

## 2023-11-04 ENCOUNTER — Ambulatory Visit: Admitting: Gastroenterology

## 2023-11-04 ENCOUNTER — Encounter: Payer: Self-pay | Admitting: Gastroenterology

## 2023-11-04 VITALS — BP 112/67 | HR 52 | Temp 98.1°F | Resp 12 | Ht 73.0 in | Wt 257.0 lb

## 2023-11-04 DIAGNOSIS — G8929 Other chronic pain: Secondary | ICD-10-CM

## 2023-11-04 DIAGNOSIS — Z800 Family history of malignant neoplasm of digestive organs: Secondary | ICD-10-CM

## 2023-11-04 DIAGNOSIS — Z860100 Personal history of colon polyps, unspecified: Secondary | ICD-10-CM

## 2023-11-04 DIAGNOSIS — D125 Benign neoplasm of sigmoid colon: Secondary | ICD-10-CM

## 2023-11-04 DIAGNOSIS — R195 Other fecal abnormalities: Secondary | ICD-10-CM

## 2023-11-04 DIAGNOSIS — K5730 Diverticulosis of large intestine without perforation or abscess without bleeding: Secondary | ICD-10-CM

## 2023-11-04 DIAGNOSIS — K5732 Diverticulitis of large intestine without perforation or abscess without bleeding: Secondary | ICD-10-CM

## 2023-11-04 DIAGNOSIS — K635 Polyp of colon: Secondary | ICD-10-CM | POA: Diagnosis not present

## 2023-11-04 DIAGNOSIS — D120 Benign neoplasm of cecum: Secondary | ICD-10-CM

## 2023-11-04 MED ORDER — SODIUM CHLORIDE 0.9 % IV SOLN: 500.0000 mL | Freq: Once | INTRAVENOUS | Status: DC

## 2023-11-04 NOTE — Progress Notes (Signed)
 Sedate, gd SR, tolerated procedure well, VSS, report to RN

## 2023-11-04 NOTE — Op Note (Signed)
 [facility] Patient Name: Timothy Anthony Procedure Date: 11/04/2023 8:57 AM MRN: [id number] Endoscopist: Lorin Picket E. Tomasa Rand , MD, [id number] Age: 73 Referring MD:  Date of Birth: March 26, 1951 Gender: Male Account #: [id number] Procedure:                Colonoscopy Indications:              Follow-up of diverticulitis, family history of                            colon cancer (father, >75) Medicines:                Monitored Anesthesia Care Procedure:                Pre-Anesthesia Assessment:                           - Prior to the procedure, a History and Physical                            was performed, and patient medications and                            allergies were reviewed. The patient's tolerance of                            previous anesthesia was also reviewed. The risks                            and benefits of the procedure and the sedation                            options and risks were discussed with the patient.                            All questions were answered, and informed consent                            was obtained. Prior Anticoagulants: The patient has                            taken no anticoagulant or antiplatelet agents. ASA                            Grade Assessment: II - A patient with mild systemic                            disease. After reviewing the risks and benefits,                            the patient was deemed in satisfactory condition to                            undergo the procedure.  After obtaining informed consent, the colonoscope                            was passed under direct vision. Throughout the                            procedure, the patient's blood pressure, pulse, and                            oxygen saturations were monitored continuously. The                            Olympus Scope SN: [id number] was introduced through                            the anus and advanced to the the  cecum, identified                            by appendiceal orifice and ileocecal valve. The                            colonoscopy was performed without difficulty. The                            patient tolerated the procedure well. The quality                            of the bowel preparation was adequate. The                            ileocecal valve, appendiceal orifice, and rectum                            were photographed. The bowel preparation used was                            SUFLAVE via split dose instruction. Scope In: 9:10:42 AM Scope Out: 9:31:08 AM Scope Withdrawal Time: 0 hours 16 minutes 31 seconds  Total Procedure Duration: 0 hours 20 minutes 26 seconds  Findings:                 The perianal and digital rectal examinations were                            normal. Pertinent negatives include normal                            sphincter tone and no palpable rectal lesions.                           Two sessile polyps were found in the cecum. The                            polyps were 5 to 8 mm in size.  These polyps were                            removed with a cold snare. Resection and retrieval                            were complete. Estimated blood loss was minimal.                           A 5 mm polyp was found in the ileocecal valve. The                            polyp was sessile. The polyp was removed with a                            cold snare. Resection and retrieval were complete.                            Estimated blood loss was minimal.                           A 3 mm polyp was found in the sigmoid colon. The                            polyp was sessile. The polyp was removed with a                            cold snare. Resection and retrieval were complete.                            Estimated blood loss was minimal.                           Many medium-mouthed and small-mouthed diverticula                            were found in the sigmoid  colon, descending colon,                            transverse colon and ascending colon. There was no                            evidence of diverticular bleeding.                           The exam was otherwise normal throughout the                            examined colon.                           The retroflexed view of the distal rectum and anal  verge was normal and showed no anal or rectal                            abnormalities. Complications:            No immediate complications. Estimated Blood Loss:     Estimated blood loss was minimal. Impression:               - Two 5 to 8 mm polyps in the cecum, removed with a                            cold snare. Resected and retrieved.                           - One 5 mm polyp at the ileocecal valve, removed                            with a cold snare. Resected and retrieved.                           - One 3 mm polyp in the sigmoid colon, removed with                            a cold snare. Resected and retrieved.                           - Moderate diverticulosis in the sigmoid colon, in                            the descending colon, in the transverse colon and                            in the ascending colon. There was no evidence of                            diverticular bleeding.                           - The distal rectum and anal verge are normal on                            retroflexion view. Recommendation:           - Patient has a contact number available for                            emergencies. The signs and symptoms of potential                            delayed complications were discussed with the                            patient. Return to normal activities tomorrow.  Written discharge instructions were provided to the                            patient.                           - Resume previous diet.                           - Continue present  medications.                           - Await pathology results.                           - Repeat colonoscopy (date not yet determined) for                            surveillance based on pathology results.                           - Recommend high fiber diet/daily fiber supplement                            to reduce risk of recurrent diverticulitis. Marcellis Frampton E. Tomasa Rand, MD 11/04/2023 9:37:39 AM This report has been signed electronically.

## 2023-11-04 NOTE — Progress Notes (Signed)
 History and Physical Interval Note:  11/04/2023 8:59 AM  Timothy Anthony  has presented today for endoscopic procedure(s), with the diagnosis of  Encounter Diagnoses  Name Primary?   Family history of colon cancer in father Yes   History of colonic polyps    Abdominal pain, chronic, right lower quadrant    Loose stools    Diverticulitis of colon   .  The various methods of evaluation and treatment have been discussed with the patient and/or family. After consideration of risks, benefits and other options for treatment, the patient has consented to  the endoscopic procedure(s).   The patient's history has been reviewed, patient examined, no change in status, stable for endoscopic procedure(s).  I have reviewed the patient's chart and labs.  Questions were answered to the patient's satisfaction.     Lavren Lewan E. Tomasa Rand, MD [facility]

## 2023-11-04 NOTE — Patient Instructions (Signed)
-  Handout on polyps, diverticulosis and high fiber diet provided -await pathology results -repeat colonoscopy for surveillance recommended. Date to be determined when pathology result become available   -Continue present medications -Daily fiber supplement recommended    YOU HAD AN ENDOSCOPIC PROCEDURE TODAY AT THE [facility]:   Refer to the procedure report that was given to you for any specific questions about what was found during the examination.  If the procedure report does not answer your questions, please call your gastroenterologist to clarify.  If you requested that your care partner not be given the details of your procedure findings, then the procedure report has been included in a sealed envelope for you to review at your convenience later.  YOU SHOULD EXPECT: Some feelings of bloating in the abdomen. Passage of more gas than usual.  Walking can help get rid of the air that was put into your GI tract during the procedure and reduce the bloating. If you had a lower endoscopy (such as a colonoscopy or flexible sigmoidoscopy) you may notice spotting of blood in your stool or on the toilet paper. If you underwent a bowel prep for your procedure, you may not have a normal bowel movement for a few days.  Please Note:  You might notice some irritation and congestion in your nose or some drainage.  This is from the oxygen used during your procedure.  There is no need for concern and it should clear up in a day or so.  SYMPTOMS TO REPORT IMMEDIATELY:  Following lower endoscopy (colonoscopy or flexible sigmoidoscopy):  Excessive amounts of blood in the stool  Significant tenderness or worsening of abdominal pains  Swelling of the abdomen that is new, acute  Fever of 100F or higher  For urgent or emergent issues, a gastroenterologist can be reached at any hour by calling [phone]. Do not use MyChart messaging for urgent concerns.    DIET:  We do recommend a small meal at  first, but then you may proceed to a high fiber diet per doctors recommendation.  Drink plenty of fluids but you should avoid alcoholic beverages for 24 hours.  ACTIVITY:  You should plan to take it easy for the rest of today and you should NOT DRIVE or use heavy machinery until tomorrow (because of the sedation medicines used during the test).    FOLLOW UP: Our staff will call the number listed on your records the next business day following your procedure.  We will call around 7:15- 8:00 am to check on you and address any questions or concerns that you may have regarding the information given to you following your procedure. If we do not reach you, we will leave a message.     If any biopsies were taken you will be contacted by phone or by letter within the next 1-3 weeks.  Please call us at [phone] if you have not heard about the biopsies in 3 weeks.    SIGNATURES/CONFIDENTIALITY: You and/or your care partner have signed paperwork which will be entered into your electronic medical record.  These signatures attest to the fact that that the information above on your After Visit Summary has been reviewed and is understood.  Full responsibility of the confidentiality of this discharge information lies with you and/or your care-partner.

## 2023-11-04 NOTE — Progress Notes (Signed)
 Pt's states no medical or surgical changes since previsit or office visit.

## 2023-11-04 NOTE — Progress Notes (Signed)
 Called to room to assist during endoscopic procedure.  Patient ID and intended procedure confirmed with present staff. Received instructions for my participation in the procedure from the performing physician.

## 2023-11-07 ENCOUNTER — Other Ambulatory Visit: Payer: Self-pay | Admitting: Medical

## 2023-11-07 ENCOUNTER — Telehealth: Payer: Self-pay

## 2023-11-07 NOTE — Telephone Encounter (Signed)
  Follow up Call-     11/04/2023    8:06 AM  Call back number  Post procedure Call Back phone  # [phone]  Permission to leave phone message Yes     Patient questions:  Do you have a fever, pain , or abdominal swelling? No. Pain Score  0 *  Have you tolerated food without any problems? Yes.    Have you been able to return to your normal activities? Yes.    Do you have any questions about your discharge instructions: Diet   No. Medications  No. Follow up visit  No.  Do you have questions or concerns about your Care? No.  Actions: * If pain score is 4 or above: No action needed, pain <4.

## 2023-11-08 LAB — SURGICAL PATHOLOGY

## 2023-11-09 ENCOUNTER — Other Ambulatory Visit: Payer: Medicare Other

## 2023-11-10 ENCOUNTER — Encounter: Payer: Self-pay | Admitting: Sports Medicine

## 2023-11-10 ENCOUNTER — Ambulatory Visit: Admitting: Sports Medicine

## 2023-11-10 VITALS — BP 130/82 | Ht 73.0 in | Wt 257.0 lb

## 2023-11-10 DIAGNOSIS — M25511 Pain in right shoulder: Secondary | ICD-10-CM

## 2023-11-10 DIAGNOSIS — G8929 Other chronic pain: Secondary | ICD-10-CM | POA: Diagnosis not present

## 2023-11-10 MED ORDER — METHYLPREDNISOLONE ACETATE 40 MG/ML IJ SUSP
40.0000 mg | Freq: Once | INTRAMUSCULAR | Status: AC
Start: 2023-11-10 — End: 2023-11-10
  Administered 2023-11-10: 40 mg via INTRA_ARTICULAR

## 2023-11-10 NOTE — Addendum Note (Signed)
 Addended by: Merrilyn Puma on: 11/10/2023 09:04 AM   Modules accepted: Orders

## 2023-11-10 NOTE — Progress Notes (Signed)
   Subjective:    Patient ID: Timothy Anthony, male    DOB: 15-Mar-1951, 73 y.o.   MRN: [id number]  HPI  Timothy Anthony presents today with persistent right shoulder pain.  He has had pain for about 3 years.  He has received multiple cortisone injections in the past.  Each injection is temporarily helpful but not long-lasting.  He has done physical therapy in the past but it has not helped.  Pain is along the lateral and anterior shoulder.  Worse with shoulder related motion as well as with sleeping at night.  X-rays done previously showed no significant osteoarthritis.  Review of Systems As above    Objective:   Physical Exam  Well-developed, well-nourished.  No acute distress  Right shoulder: Good range of motion with a positive painful arc.  He is tender to palpation directly over the bicipital groove.  Positive empty can, positive Hawkins.  Rotator cuff strength is 5/5 but does reproduce pain with resisted supraspinatus and infraspinatus.  Equivocal O'Brien's.  Good pulses.      Assessment & Plan:   Longstanding right shoulder pain likely secondary to rotator cuff tendinopathy  MRI for presurgical planning.  Patient has failed conservative treatments to date and he would be interested in surgery.  I will follow-up with him via MyChart with those results when available.  In the meantime, we will repeat a subacromial cortisone injection today.  Consent obtained and verified. Time-out conducted. Noted no overlying erythema, induration, or other signs of local infection. Skin prepped in a sterile fashion. Topical analgesic spray: Ethyl chloride. Joint: Right shoulder, subacromial Needle: 25-gauge 1.5 inch Completed without difficulty. Meds: 3 cc 1% Xylocaine, 1 cc (40 mg) Depo-Medrol  This note was dictated using Dragon naturally speaking software and may contain errors in syntax, spelling, or content which have not been identified prior to signing this note.

## 2023-11-14 ENCOUNTER — Encounter: Payer: Self-pay | Admitting: Gastroenterology

## 2023-11-14 NOTE — Progress Notes (Signed)
 Mr. Timothy Anthony,   Three of the four polyps that I removed during your recent procedure were completely benign but were proven to be "pre-cancerous" polyps that MAY have grown into cancers if they had not been removed.  Studies shows that at least 20% of women over age 71 and 30% of men over age 48 have pre-cancerous polyps.  Based on current nationally recognized surveillance guidelines, I recommend that you have a repeat colonoscopy in 5 years.   If you develop any new rectal bleeding, abdominal pain or significant bowel habit changes, please contact me before then.

## 2023-11-15 ENCOUNTER — Telehealth: Payer: Self-pay

## 2023-11-15 ENCOUNTER — Other Ambulatory Visit: Payer: Self-pay | Admitting: *Deleted

## 2023-11-15 MED ORDER — DIAZEPAM 5 MG PO TABS: ORAL_TABLET | ORAL | 0 refills | Status: DC

## 2023-11-15 NOTE — Telephone Encounter (Signed)
 Pt is wanting to know if he needs to have Lipids checked. Please advised

## 2023-11-16 ENCOUNTER — Ambulatory Visit (HOSPITAL_BASED_OUTPATIENT_CLINIC_OR_DEPARTMENT_OTHER)
Admission: RE | Admit: 2023-11-16 | Discharge: 2023-11-16 | Disposition: A | Discharge status: Home / Self Care | Source: Ambulatory Visit | Attending: Sports Medicine | Admitting: Sports Medicine

## 2023-11-16 DIAGNOSIS — M129 Arthropathy, unspecified: Secondary | ICD-10-CM | POA: Diagnosis not present

## 2023-11-16 DIAGNOSIS — M7581 Other shoulder lesions, right shoulder: Secondary | ICD-10-CM | POA: Diagnosis not present

## 2023-11-16 DIAGNOSIS — M75121 Complete rotator cuff tear or rupture of right shoulder, not specified as traumatic: Secondary | ICD-10-CM | POA: Diagnosis not present

## 2023-11-16 DIAGNOSIS — M25511 Pain in right shoulder: Secondary | ICD-10-CM | POA: Insufficient documentation

## 2023-11-16 DIAGNOSIS — G8929 Other chronic pain: Secondary | ICD-10-CM | POA: Insufficient documentation

## 2023-11-16 NOTE — Telephone Encounter (Signed)
 Called and spoke to patient who reports that he will be in [place] from next weekend until May. Advised patient that I will let Dr. Vincent Gros know.

## 2023-11-28 ENCOUNTER — Encounter: Payer: Self-pay | Admitting: Sports Medicine

## 2023-12-26 MED ORDER — ATORVASTATIN CALCIUM 10 MG PO TABS: 10.0000 mg | ORAL_TABLET | ORAL | 3 refills | Status: DC

## 2023-12-29 ENCOUNTER — Telehealth

## 2023-12-29 ENCOUNTER — Ambulatory Visit

## 2023-12-29 VITALS — BP 129/80 | HR 74 | Ht 73.0 in | Wt 252.0 lb

## 2023-12-29 DIAGNOSIS — E782 Mixed hyperlipidemia: Secondary | ICD-10-CM

## 2023-12-29 DIAGNOSIS — Z789 Other specified health status: Secondary | ICD-10-CM

## 2023-12-29 NOTE — Progress Notes (Signed)
 Cardiology Consultation:    Date:  12/29/2023   ID:  Timothy Anthony, DOB 1950-11-04, MRN [id number]  PCP:  Timothy Everts, PA-C  Cardiologist:  Timothy Evans Arvie Villarruel, MD   Referring MD: Timothy Everts, PA-C   Chief Complaint  Patient presents with   Follow-up     ASSESSMENT AND PLAN:   [name] 73 year old male history of hyperlipidemia, statin intolerance [tried rosuvastatin ], hypertension, carotid atherosclerosis, hypothyroidism, nonobstructive renal stones, diverticulosis, former smoker [quit 45 years ago], right flank spigelian hernia, first-degree AV block on EKG, family history of colon cancer and undergoes regular screening colonoscopies, coronary atherosclerosis on CT abdomen identified in January 2025.  Did not tolerate rosuvastatin  well due to various side effects as reviewed below. Wants to hold off on statins at this time. Discussed various options for lipid-lowering strategy. Currently without any significant prior CAD or CVA history other than coronary atherosclerosis noted and lipid panel not markedly abnormal, recommended to continue with aggressive dietary and lifestyle modifications for now and we can repeat lipid panel in about 6 months once he returns back to [place]  and we will discuss further options for lipid-lowering therapy if indicated at the time.  Problem List Items Addressed This Visit     Mixed hyperlipidemia - Primary   Given intolerance to Crestor  and unwillingness to try other statins at this time, continue with dietary and lifestyle modifications.  The indication for statins was overall cardiovascular risk reduction and not necessarily for LDL reduction. Additional benefits of statins in terms of plaque maturation and preventing heart attack and MI was discussed.  Will follow-up fasting lipid panel tentatively in 6 months once he returns to [place]  and review options for other lipid-lowering therapy such as ezetimibe or bempedoic  acid.      Relevant Orders   Lipid Profile   Statin intolerance   Significant intolerance to Crestor .  He is at this time and willing to try other statins. Lipid-lowering therapy as discussed above.       Follow-up in 6 months.  Telehealth video visit completed today. Patient at his home in [place] . Provider location office [facility]. Total time: Reviewing the chart 5 minutes, documentation 5 minutes, discussing over the video conference 5 minutes. Total time 15 minutes.    History of Present Illness:    Timothy Anthony is a 73 y.o. male who is being seen today for follow-up via telehealth visit.  Patient at his home in [place] . PCP is Saguier, Gaylin Ke, PA-C.  Has a history of hyperlipidemia, statin intolerance [tried rosuvastatin ], hypertension, carotid atherosclerosis, hypothyroidism, nonobstructive renal stones, diverticulosis, former smoker [quit 45 years ago], right flank spigelian hernia, first-degree AV block on EKG, family history of colon cancer and undergoes regular screening colonoscopies, coronary atherosclerosis on CT abdomen identified in January 2025.  At last office visit reviewed his palpitations which appear to correlate with elevated blood pressures. Requested echocardiogram and stress echocardiogram for restratification and vascular ultrasound of the carotids for follow-up and recommended statins and aspirin for cardiovascular risk reduction.  Echocardiogram from 10-07-2023 noted normal biventricular function LVEF 60 to 65% with mild LVH, grade 1 diastolic dysfunction.  Otherwise no significant valve abnormalities.  Ascending aorta was noted 3.7 cm in size considering body surface area this falls within normal limits.  Stress echocardiogram from 10/12/2023 noted average exercise capacity 6 minutes 30 seconds, attaining 7.7 METS, peak heart rate 151/min was 101% of MPHR, no EKG or echocardiographic findings suggestive of ischemia.  Carotid  vascular ultrasound study 10-07-2023 noted mild carotid atherosclerosis with less than 40% bilateral stenosis, bilateral vertebral artery flow was antegrade and subclavian flow dynamics were normal.   He reached out to our office earlier this month with symptoms of lightheadedness, dizziness constipation and joint pain and reached out to our office after discontinuing the medication.  Recommended alternate statins versus holding off on medications.  Scheduled for televisit today at his request to further review the options. Overall reiterates his symptoms were significant and unable to tolerate statins. He has been modifying his diet and lost about 15 pounds in the past few months. No further cardiac symptoms at this time.   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   Anxiety    Colon polyp    Coronary artery disease    Diverticulosis    Essential hypertension 08/24/2021   Excessive gas 05/28/2019   Gallstones    Gastroesophageal reflux disease without esophagitis 01/13/2016   Hyperlipidemia    Hypertension    Hypothyroidism    Labral tear of shoulder, degenerative, 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   Mixed hyperlipidemia 08/07/2020   Ventral hernia without obstruction or gangrene 08/24/2021    Past Surgical History:  Procedure Laterality Date   APPENDECTOMY  08/17/1991   calcium  deposits removal Bilateral 09/2006 and 09/2015   off the eyelids   COLONOSCOPY     HERNIA REPAIR  1995,2003,2005,2006,2011   hernia repairs after each surgery    Current Medications: Current Meds  Medication Sig   levothyroxine (SYNTHROID) 150 MCG tablet Take 150 mcg by mouth daily before breakfast.   perindopril  (ACEON ) 4 MG tablet TAKE 1 TABLET BY MOUTH DAILY     Allergies:   Crestor  [rosuvastatin  calcium ]   Social History   Socioeconomic History   Marital status: Married    Spouse name: Not on file   Number of children:  Not on file   Years of education: Not on file   Highest education level: 12th grade  Occupational History   Not on file  Tobacco Use   Smoking status: Former    Current packs/day: 0.00    Average packs/day: 0.5 packs/day for 6.0 years (3.0 ttl pk-yrs)    Types: Cigarettes    Start date: 08/20/1971    Quit date: 08/19/1977    Years since quitting: 46.3   Smokeless tobacco: Not on file  Vaping Use   Vaping status: Never Used  Substance and Sexual Activity   Alcohol use: Yes    Comment: rare in summers.   Drug use: No   Sexual activity: Yes  Other Topics Concern   Not on file  Social History Narrative   Not on file   Social Drivers of Health   Financial Resource Strain: Low Risk  (09/19/2023)   Overall Financial Resource Strain (CARDIA)    Difficulty of Paying Living Expenses: Not hard at all  Food Insecurity: No Food Insecurity (09/19/2023)   Hunger Vital Sign    Worried About Running Out of Food in the Last Year: Never true    Ran Out of Food in the Last Year: Never true  Transportation Needs: No Transportation Needs (09/19/2023)   PRAPARE - Administrator, Civil Service (Medical): No    Lack of Transportation (Non-Medical): No  Physical Activity: Sufficiently Active (09/19/2023)   Exercise Vital Sign    Days of Exercise per Week: 4 days    Minutes of  Exercise per Session: 150+ min  Stress: No Stress Concern Present (09/19/2023)   [employer] of Occupational Health - Occupational Stress Questionnaire    Feeling of Stress : Not at all  Social Connections: Moderately Isolated (09/19/2023)   Social Connection and Isolation Panel [NHANES]    Frequency of Communication with Friends and Family: More than three times a week    Frequency of Social Gatherings with Friends and Family: Three times a week    Attends Religious Services: Never    Active Member of Clubs or Organizations: No    Attends Engineer, structural: Not on file    Marital Status: Married      Family History: The patient's family history includes Cancer in his brother; Celiac disease in his father; Colon cancer in his father; Colon polyps in his father; Heart disease in his brother. There is no history of Allergic rhinitis, Angioedema, Asthma, Eczema, Immunodeficiency, Urticaria, Esophageal cancer, Stomach cancer, or Rectal cancer. ROS:   Please see the history of present illness.    All 14 point review of systems negative except as described per history of present illness.  EKGs/Labs/Other Studies Reviewed:    The following studies were reviewed today:   EKG:       Recent Labs: 07/28/2023: TSH 3.03 09/19/2023: Hemoglobin 16.5; Platelets 253.0 10/24/2023: ALT 17; BUN 12; Creatinine, Ser 1.00; Potassium 5.0; Sodium 142  Recent Lipid Panel    Component Value Date/Time   CHOL 186 09/16/2023 1011   TRIG 159.0 (H) 09/16/2023 1011   HDL 31.80 (L) 09/16/2023 1011   CHOLHDL 6 09/16/2023 1011   VLDL 31.8 09/16/2023 1011   LDLCALC 122 (H) 09/16/2023 1011    Physical Exam:    VS:  BP 129/80   Pulse 74   Ht 6\' 1"  (1.854 m)   Wt 252 lb (114.3 kg) Comment: Verbal, video visit  BMI 33.25 kg/m     Wt Readings from Last 3 Encounters:  12/29/23 252 lb (114.3 kg)  11/10/23 257 lb (116.6 kg)  11/04/23 257 lb (116.6 kg)     GENERAL:  Well nourished, well developed in no acute distress NEUROLOGIC:  Alert and oriented x 3  Medication Adjustments/Labs and Tests Ordered: Current medicines are reviewed at length with the patient today.  Concerns regarding medicines are outlined above.  Orders Placed This Encounter  Procedures   Lipid Profile   No orders of the defined types were placed in this encounter.   Signed, Shailee Foots reddy Ayslin Kundert, MD, MPH, [facility]. 12/29/2023 11:48 AM    [facility]

## 2023-12-29 NOTE — Assessment & Plan Note (Signed)
 Significant intolerance to Crestor .  He is at this time and willing to try other statins. Lipid-lowering therapy as discussed above.

## 2023-12-29 NOTE — Patient Instructions (Signed)
 Medication Instructions:  Your physician recommends that you continue on your current medications as directed. Please refer to the Current Medication list given to you today.  *If you need a refill on your cardiac medications before your next appointment, please call your pharmacy*  Lab Work: Your physician recommends that you return for lab work in:   Labs in October: Lipid  If you have labs (blood work) drawn today and your tests are completely normal, you will receive your results only by: [employer] (if you have MyChart) OR A paper copy in the mail If you have any lab test that is abnormal or we need to change your treatment, we will call you to review the results.  Testing/Procedures: None  Follow-Up: At [facility], you and your health needs are our priority.  As part of our continuing mission to provide you with exceptional heart care, our providers are all part of one team.  This team includes your primary Cardiologist (physician) and Advanced Practice Providers or APPs (Physician Assistants and Nurse Practitioners) who all work together to provide you with the care you need, when you need it.  Your next appointment:   6 month(s)  Provider:   Bertha Broad, MD    We recommend signing up for the patient portal called "MyChart".  Sign up information is provided on this After Visit Summary.  MyChart is used to connect with patients for Virtual Visits (Telemedicine).  Patients are able to view lab/test results, encounter notes, upcoming appointments, etc.  Non-urgent messages can be sent to your provider as well.   To learn more about what you can do with MyChart, go to [email or web address].   Other Instructions None

## 2023-12-29 NOTE — Assessment & Plan Note (Signed)
 Given intolerance to Crestor  and unwillingness to try other statins at this time, continue with dietary and lifestyle modifications.  The indication for statins was overall cardiovascular risk reduction and not necessarily for LDL reduction. Additional benefits of statins in terms of plaque maturation and preventing heart attack and MI was discussed.  Will follow-up fasting lipid panel tentatively in 6 months once he returns to [place]  and review options for other lipid-lowering therapy such as ezetimibe or bempedoic acid.

## 2024-01-25 ENCOUNTER — Other Ambulatory Visit: Payer: Self-pay | Admitting: Medical

## 2024-01-25 MED ORDER — LEVOTHYROXINE SODIUM 150 MCG PO TABS: 150.0000 ug | ORAL_TABLET | Freq: Every day | ORAL | 0 refills | Status: DC

## 2024-01-25 NOTE — Telephone Encounter (Signed)
 Copied from CRM [id number]. Topic: Clinical - Medication Refill >> Jan 25, 2024  9:40 AM Dewanda Foots wrote: Medication:  levothyroxine (SYNTHROID) 150 MCG tablet, 90-day supply is requested please   Has the patient contacted their pharmacy? No (Agent: If no, request that the patient contact the pharmacy for the refill. If patient does not wish to contact the pharmacy document the reason why and proceed with request.) (Agent: If yes, when and what did the pharmacy advise?)  This is the patient's preferred pharmacy:   [facility] - [place], [place] - [address] [place] [address] Phone: [phone] Fax: [phone]  Is this the correct pharmacy for this prescription? Yes If no, delete pharmacy and type the correct one.   Has the prescription been filled recently? No  Is the patient out of the medication? No, but almost out. He has a couple of days left  Has the patient been seen for an appointment in the last year OR does the patient have an upcoming appointment? Yes  Can we respond through MyChart? No, please call [phone] with questions or updates.   Agent: Please be advised that Rx refills may take up to 3 business days. We ask that you follow-up with your pharmacy.

## 2024-03-20 ENCOUNTER — Other Ambulatory Visit: Payer: Self-pay | Admitting: Medical

## 2024-05-31 ENCOUNTER — Ambulatory Visit

## 2024-05-31 VITALS — BP 130/60 | Ht 73.0 in | Wt 252.0 lb

## 2024-05-31 DIAGNOSIS — M75121 Complete rotator cuff tear or rupture of right shoulder, not specified as traumatic: Secondary | ICD-10-CM | POA: Diagnosis not present

## 2024-05-31 NOTE — Progress Notes (Signed)
   Subjective:    Patient ID: Timothy Anthony, male    DOB: 73 y.o., [birth year]   MRN: [id number]  Chief Complaint: Full-thickness supraspinatus tearing, biceps tendinosis, infraspinatus tendinosis.  Discussed the use of AI scribe software for clinical note transcription with the patient, who gave verbal consent to proceed.  History of Present Illness Timothy Anthony is a 73 year old male with a full thickness tear of the supraspinatus tendon who presents for evaluation of shoulder pain and function. He was referred by Dr. Arvell for evaluation of his shoulder condition.  Shoulder pain and functional limitation - Ongoing shoulder pain due to full thickness tear of the supraspinatus tendon - Pain intensifies with activities requiring arm elevation above shoulder level, such as working on ceilings - Initial onset of pain four years ago, beginning at shoulder height - Progressive worsening of pain and functional limitation over time - Physical therapy has not provided relief - Uses topical Blue Emu for pain relief - Avoids oral pain medications - No current use of pain medications  Activity modification and lifestyle impact - Remains active with remodeling projects, including painting and moving furniture - Adjusts activities based on pain levels - Manages condition through activity modification rather than formal therapy  Substance use - No tobacco use  Review of pertinent imaging: 11/16/2023 MRI of the right shoulder showing moderate supraspinatus tendinosis with complete full-thickness full width tear with approximately 17 mm of retraction.  Severe infraspinatus tendinosis with a small partial-thickness articular surface tear anteriorly.  Moderate subscapularis tendinosis.  Severe tendinosis of the intra-articular portion of the long head of the biceps tendon.  Objective:   Vitals:   05/31/24 0848  BP: 130/60   Const: appears well, non-toxic, well groomed Psych: affect bright,  interactive, smiling EENT: EOMI intact, conjunctiva appear normal Neck: no obvious masses, appears symmetric Resp: non-labored, appears symmetric Neuro: muscle bulk appears normal Skin: no obvious rashes noted  Right Shoulder ( compared to normal ) Inspection: - swelling, - scapular dyskinesis AROM/PROM: 170 forward flexion, 170 abduction, 50 external rotation, internal rotation to lumbar spine Strength: 5/5 lift off, 5-/5 empty can, 5/5 external rotation, 5/5 flexion, - drop arm test     Assessment & Plan:   Assessment & Plan 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 He manages symptoms with topical treatments like Blue Emu and remains active.  Based on his imaging and the limitations he states this poses to him, I do recommend consultation for surgery though I am quite impressed that his level of function despite the significance of his MRI findings. He is considering surgery timing in relation to home renovations he is planning starting in March in [place] . Refer to Dr. Cristy for surgical consultation for cuff repair. Discuss surgical options and timing with the surgeon, considering his activities and projects. Coordinate with the surgeon's office to schedule the consultation promptly.

## 2024-06-05 ENCOUNTER — Ambulatory Visit

## 2024-06-05 VITALS — BP 122/62 | HR 68 | Ht 73.0 in | Wt 250.1 lb

## 2024-06-05 DIAGNOSIS — Z01818 Encounter for other preprocedural examination: Secondary | ICD-10-CM | POA: Diagnosis not present

## 2024-06-05 DIAGNOSIS — M7521 Bicipital tendinitis, right shoulder: Secondary | ICD-10-CM | POA: Diagnosis not present

## 2024-06-05 DIAGNOSIS — M75111 Incomplete rotator cuff tear or rupture of right shoulder, not specified as traumatic: Secondary | ICD-10-CM | POA: Diagnosis not present

## 2024-06-05 DIAGNOSIS — I2510 Atherosclerotic heart disease of native coronary artery without angina pectoris: Secondary | ICD-10-CM

## 2024-06-05 DIAGNOSIS — I10 Essential (primary) hypertension: Secondary | ICD-10-CM

## 2024-06-05 DIAGNOSIS — E782 Mixed hyperlipidemia: Secondary | ICD-10-CM | POA: Diagnosis not present

## 2024-06-05 DIAGNOSIS — Z01810 Encounter for preprocedural cardiovascular examination: Secondary | ICD-10-CM

## 2024-06-05 DIAGNOSIS — M66311 Spontaneous rupture of flexor tendons, right shoulder: Secondary | ICD-10-CM | POA: Diagnosis not present

## 2024-06-05 DIAGNOSIS — M75121 Complete rotator cuff tear or rupture of right shoulder, not specified as traumatic: Secondary | ICD-10-CM | POA: Diagnosis not present

## 2024-06-05 DIAGNOSIS — M25819 Other specified joint disorders, unspecified shoulder: Secondary | ICD-10-CM | POA: Diagnosis not present

## 2024-06-05 DIAGNOSIS — M19011 Primary osteoarthritis, right shoulder: Secondary | ICD-10-CM | POA: Diagnosis not present

## 2024-06-05 NOTE — Assessment & Plan Note (Signed)
  Was unable to tolerate statins. Will really visit lipid levels with repeat blood work along with lipoprotein a. Target LDL levels below 70 mg/dL.  To start Zetia 10 mg once daily if not at target.  If lipoprotein a levels are elevated would recommend PCSK9 inhibitors.

## 2024-06-05 NOTE — Assessment & Plan Note (Signed)
 Coronary atherosclerosis noted incidentally noted on CT abdomen in January 2025. Stress echocardiogram in February 2025 no ischemia. Normal biventricular function on echocardiogram February 2025.   Previously reviewed aspirin.  Okay to continue taking as tolerated.  Was unable to tolerate statins. Will really visit lipid levels with repeat blood work along with lipoprotein a. Target LDL levels below 70 mg/dL.  To start Zetia 10 mg once daily if not at target.  If lipoprotein a levels are elevated would recommend PCSK9 inhibitors.

## 2024-06-05 NOTE — Assessment & Plan Note (Signed)
 Well-controlled on current treatment with perindopril 

## 2024-06-05 NOTE — Patient Instructions (Signed)
 Medication Instructions:  Your physician recommends that you continue on your current medications as directed. Please refer to the Current Medication list given to you today.  *If you need a refill on your cardiac medications before your next appointment, please call your pharmacy*  Lab Work: Your physician recommends that you return for lab work in:   Labs today: fasting lipids, Lpa  If you have labs (blood work) drawn today and your tests are completely normal, you will receive your results only by: MyChart Message (if you have MyChart) OR A paper copy in the mail If you have any lab test that is abnormal or we need to change your treatment, we will call you to review the results.  Testing/Procedures: None  Follow-Up: At [facility], you and your health needs are our priority.  As part of our continuing mission to provide you with exceptional heart care, our providers are all part of one team.  This team includes your primary Cardiologist (physician) and Advanced Practice Providers or APPs (Physician Assistants and Nurse Practitioners) who all work together to provide you with the care you need, when you need it.  Your next appointment:   10 month(s)  Provider:   Alean Kobus, MD    We recommend signing up for the patient portal called MyChart.  Sign up information is provided on this After Visit Summary.  MyChart is used to connect with patients for Virtual Visits (Telemedicine).  Patients are able to view lab/test results, encounter notes, upcoming appointments, etc.  Non-urgent messages can be sent to your provider as well.   To learn more about what you can do with MyChart, go to [email or web address].   Other Instructions None

## 2024-06-05 NOTE — Assessment & Plan Note (Signed)
 Has elective shoulder surgery coming up with Dr.Varkey in [place]. Excellent functional status. No cardiac signs or symptoms of chest pain, shortness of breath, heart failure. Recent stress echocardiogram February 2025 without ischemia.  From cardiac standpoint okay to proceed with surgery as being planned.  Low risk for perioperative cardiac complications.

## 2024-06-05 NOTE — Progress Notes (Signed)
 Cardiology Consultation:    Date:  06/05/2024   ID:  PENN GRISSETT, DOB 26-Sep-1950, MRN [id number]  PCP:  Dorina Loving, PA-C  Cardiologist:  Alean SAUNDERS Jasneet Schobert, MD   Referring MD: Saguier, Edward, PA-C   No chief complaint on file.    ASSESSMENT AND PLAN:   Mr Timothy Anthony 73 year old male with history of hyperlipidemia, statin intolerance [tried rosuvastatin ], hypertension, carotid atherosclerosis, hypothyroidism, nonobstructive renal stones, diverticulosis, former smoker [quit 45 years ago], right flank spigelian hernia, first-degree AV block on EKG, family history of colon cancer and undergoes regular screening colonoscopies, coronary atherosclerosis on CT abdomen identified in January 2025.   Stress echocardiogram 10/12/2023, no ischemia. Transthoracic echocardiogram 10/07/2023 normal biventricular function LVEF 60 to 65%, no significant valve abnormalities. Carotid ultrasound 10/07/2023 mild carotid atherosclerosis with less than 40% stenosis bilaterally.  Here for follow-up visit regarding hyperlipidemia and for preop risk assessment prior to his elective right shoulder surgery coming up.  Problem List Items Addressed This Visit     Essential hypertension   Well-controlled on current treatment with perindopril       Mixed hyperlipidemia - Primary    Was unable to tolerate statins. Will really visit lipid levels with repeat blood work along with lipoprotein a. Target LDL levels below 70 mg/dL.  To start Zetia 10 mg once daily if not at target.  If lipoprotein a levels are elevated would recommend PCSK9 inhibitors.       Relevant Orders   Lipid panel   Lipoprotein A (LPA)   Coronary artery disease   Coronary atherosclerosis noted incidentally noted on CT abdomen in January 2025. Stress echocardiogram in February 2025 no ischemia. Normal biventricular function on echocardiogram February 2025.   Previously reviewed aspirin.  Okay to continue taking as tolerated.  Was  unable to tolerate statins. Will really visit lipid levels with repeat blood work along with lipoprotein a. Target LDL levels below 70 mg/dL.  To start Zetia 10 mg once daily if not at target.  If lipoprotein a levels are elevated would recommend PCSK9 inhibitors.       Preop cardiovascular exam   Has elective shoulder surgery coming up with Dr.Varkey in [place]. Excellent functional status. No cardiac signs or symptoms of chest pain, shortness of breath, heart failure. Recent stress echocardiogram February 2025 without ischemia.  From cardiac standpoint okay to proceed with surgery as being planned.  Low risk for perioperative cardiac complications.      Return to clinic tentatively in 1 year. Earlier follow-up based on test results.   History of Present Illness:    Timothy Anthony is a 73 y.o. male who is being seen today for follow-up visit. PCP is Saguier, Loving, PA-C. Last visit with me was televideo visit 12/29/2023.  Mentions has right shoulder surgery being planned with Dr. Cristy  in [place].  Pleasant gentleman who lives with his wife at home.  He lives here in [place]  during summer months.  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   Echocardiogram from 10-07-2023 noted normal biventricular function LVEF 60 to 65% with mild LVH, grade 1 diastolic dysfunction.  Otherwise no significant valve abnormalities.  Ascending aorta was noted 3.7 cm in size considering body surface area this falls within normal limits.  Stress echocardiogram from 10/12/2023 noted average exercise capacity 6 minutes 30 seconds,  attaining 7.7 METS, peak heart rate 151/min was 101% of MPHR, no ischemic EKG or echocardiographic findings.   Carotid vascular ultrasound study 10-07-2023 noted mild carotid atherosclerosis with less than 40% bilateral stenosis, bilateral vertebral artery flow was antegrade and subclavian flow dynamics were normal.  Right lipid panel 09/16/2023 total cholesterol 186, triglycerides 159, HDL 31, LDL 122.  Here for follow-up visit. Denies any cardiac symptoms.  Has been keeping active without any limitations.  Has lost 11 pounds in the past few months. Has modified his diet.  Atypical vague chest discomfort which has been going on for years without any change.  No exertional chest discomfort.  No orthopnea, paroxysmal nocturnal dyspnea.  No shortness of breath with exertion. No pedal edema. No lightheadedness, dizziness or syncopal episodes.  Severely limited due to side effects on taking statins and he prefers not to take any.  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   Anxiety    Colon polyp    Coronary artery disease    Diverticulosis    Essential hypertension 08/24/2021   Excessive gas 05/28/2019   Gallstones    Gastroesophageal reflux disease without esophagitis 01/13/2016   Hyperlipidemia    Hypertension    Hypothyroidism    Labral tear of shoulder, degenerative, 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   Mixed hyperlipidemia 08/07/2020   Ventral hernia without obstruction or gangrene 08/24/2021    Past Surgical History:  Procedure Laterality Date   APPENDECTOMY  08/17/1991   calcium  deposits removal Bilateral 09/2006 and 09/2015   off the eyelids   COLONOSCOPY     HERNIA REPAIR  1995,2003,2005,2006,2011   hernia repairs after each surgery    Current Medications: Current Meds  Medication Sig   levothyroxine  (SYNTHROID ) 150 MCG tablet TAKE 1 TABLET BY MOUTH DAILY  BEFORE BREAKFAST   perindopril  (ACEON ) 4  MG tablet TAKE 1 TABLET BY MOUTH DAILY     Allergies:   Crestor  [rosuvastatin  calcium ]   Social History   Socioeconomic History   Marital status: Married    Spouse name: Not on file   Number of children: Not on file   Years of education: Not on file   Highest education level: 12th grade  Occupational History   Not on file  Tobacco Use   Smoking status: Former    Current packs/day: 0.00    Average packs/day: 0.5 packs/day for 6.0 years (3.0 ttl pk-yrs)    Types: Cigarettes    Start date: 08/20/1971    Quit date: 08/19/1977    Years since quitting: 46.8   Smokeless tobacco: Not on file  Vaping Use   Vaping status: Never Used  Substance and Sexual Activity   Alcohol use: Yes    Comment: rare in summers.   Drug use: No   Sexual activity: Yes  Other Topics Concern   Not on file  Social History Narrative   Not on file   Social Drivers of Health   Financial Resource Strain: Low Risk  (09/19/2023)   Overall Financial Resource Strain (CARDIA)    Difficulty of Paying Living Expenses: Not hard at all  Food Insecurity: No Food Insecurity (09/19/2023)   Hunger Vital Sign    Worried About Running Out of Food in the Last Year: Never true    Ran Out of Food in the Last Year: Never true  Transportation Needs: No Transportation  Needs (09/19/2023)   PRAPARE - Administrator, Civil Service (Medical): No    Lack of Transportation (Non-Medical): No  Physical Activity: Sufficiently Active (09/19/2023)   Exercise Vital Sign    Days of Exercise per Week: 4 days    Minutes of Exercise per Session: 150+ min  Stress: No Stress Concern Present (09/19/2023)   [employer] of Occupational Health - Occupational Stress Questionnaire    Feeling of Stress : Not at all  Social Connections: Moderately Isolated (09/19/2023)   Social Connection and Isolation Panel    Frequency of Communication with Friends and Family: More than three times a week    Frequency of Social Gatherings with Friends and  Family: Three times a week    Attends Religious Services: Never    Active Member of Clubs or Organizations: No    Attends Engineer, structural: Not on file    Marital Status: Married     Family History: The patient's family history includes Cancer in his brother; Celiac disease in his father; Colon cancer in his father; Colon polyps in his father; Heart disease in his brother. There is no history of Allergic rhinitis, Angioedema, Asthma, Eczema, Immunodeficiency, Urticaria, Esophageal cancer, Stomach cancer, or Rectal cancer. ROS:   Please see the history of present illness.    All 14 point review of systems negative except as described per history of present illness.  EKGs/Labs/Other Studies Reviewed:    The following studies were reviewed today:   EKG:       Recent Labs: 07/28/2023: TSH 3.03 09/19/2023: Hemoglobin 16.5; Platelets 253.0 10/24/2023: ALT 17; BUN 12; Creatinine, Ser 1.00; Potassium 5.0; Sodium 142  Recent Lipid Panel    Component Value Date/Time   CHOL 186 09/16/2023 1011   TRIG 159.0 (H) 09/16/2023 1011   HDL 31.80 (L) 09/16/2023 1011   CHOLHDL 6 09/16/2023 1011   VLDL 31.8 09/16/2023 1011   LDLCALC 122 (H) 09/16/2023 1011    Physical Exam:    VS:  BP 122/62   Pulse 68   Ht 6' 1 (1.854 m)   Wt 250 lb 1.9 oz (113.5 kg)   SpO2 97%   BMI 33.00 kg/m     Wt Readings from Last 3 Encounters:  06/05/24 250 lb 1.9 oz (113.5 kg)  05/31/24 252 lb (114.3 kg)  12/29/23 252 lb (114.3 kg)     GENERAL:  Well nourished, well developed in no acute distress NECK: No JVD; No carotid bruits CARDIAC: RRR, S1 and S2 present, no murmurs, no rubs, no gallops CHEST:  Clear to auscultation without rales, wheezing or rhonchi  Extremities: No pitting pedal edema. Pulses bilaterally symmetric with radial 2+ and dorsalis pedis 2+ NEUROLOGIC:  Alert and oriented x 3  Medication Adjustments/Labs and Tests Ordered: Current medicines are reviewed at length with the  patient today.  Concerns regarding medicines are outlined above.  Orders Placed This Encounter  Procedures   Lipid panel   Lipoprotein A (LPA)   No orders of the defined types were placed in this encounter.   Signed, Alean jess Kobus, MD, MPH, [facility]. 06/05/2024 2:50 PM    [facility]

## 2024-06-06 ENCOUNTER — Telehealth (HOSPITAL_BASED_OUTPATIENT_CLINIC_OR_DEPARTMENT_OTHER): Payer: Self-pay | Admitting: *Deleted

## 2024-06-06 DIAGNOSIS — E782 Mixed hyperlipidemia: Secondary | ICD-10-CM | POA: Diagnosis not present

## 2024-06-06 NOTE — Telephone Encounter (Signed)
   Patient Name: Timothy Anthony  DOB: 29-Dec-1950 MRN: [id number]  Primary Cardiologist: Dr. Liborio  Preop cardiovascular exam     Has elective shoulder surgery coming up with Dr.Varkey in [place]. Excellent functional status. No cardiac signs or symptoms of chest pain, shortness of breath, heart failure. Recent stress echocardiogram February 2025 without ischemia.   From cardiac standpoint okay to proceed with surgery as being planned.  Low risk for perioperative cardiac complications.       The patient was advised that if he develops new symptoms prior to surgery to contact our office to arrange for a follow-up visit, and he verbalized understanding.  I will route this recommendation to the requesting party via Epic fax function and remove from pre-op pool.  Please call with questions.  Lamarr Satterfield, NP 06/06/2024, 1:52 PM

## 2024-06-06 NOTE — Telephone Encounter (Signed)
   Pre-operative Risk Assessment    Patient Name: Timothy Anthony  DOB: 01-10-1951 MRN: [id number]   Date of last office visit: 06/05/2024 Date of next office visit: None   Request for Surgical Clearance    Procedure:  Right Shoulder Scope, Rotator Cuff Repair.  Date of Surgery:  Clearance 06/18/24                                 Surgeon:  Dr. Bonner [facility] Surgeon[facility] Group or Practice Name:  [facility] Phone number:  [phone] Fax number:  [phone]   Type of Clearance Requested:   - Medical    Type of Anesthesia:  Spinal Interscalene block   Additional requests/questions:  Routed to Dr. Liborio, seen patient 06/05/24  Signed, Edsel Grayce Sanders   06/06/2024, 1:41 PM

## 2024-06-07 LAB — LIPID PANEL
Chol/HDL Ratio: 4.8 ratio (ref 0.0–5.0)
Cholesterol, Total: 163 mg/dL (ref 100–199)
HDL: 34 mg/dL — ABNORMAL LOW (ref >39)
LDL Chol Calc (NIH): 111 mg/dL — ABNORMAL HIGH (ref 0–99)
Triglycerides: 96 mg/dL (ref 0–149)
VLDL Cholesterol Cal: 18 mg/dL (ref 5–40)

## 2024-06-07 LAB — LIPOPROTEIN A (LPA): Lipoprotein (a): 117.7 nmol/L — ABNORMAL HIGH (ref <75.0)

## 2024-06-18 HISTORY — PX: ROTATOR CUFF REPAIR: SHX139

## 2024-07-10 ENCOUNTER — Encounter: Admitting: Family Medicine

## 2024-07-19 ENCOUNTER — Encounter: Payer: Self-pay | Admitting: Family Medicine

## 2024-07-19 ENCOUNTER — Ambulatory Visit: Admitting: Family Medicine

## 2024-07-19 VITALS — BP 120/60 | HR 68 | Temp 97.7°F | Ht 73.0 in | Wt 264.0 lb

## 2024-07-19 DIAGNOSIS — E785 Hyperlipidemia, unspecified: Secondary | ICD-10-CM

## 2024-07-19 DIAGNOSIS — E782 Mixed hyperlipidemia: Secondary | ICD-10-CM | POA: Diagnosis not present

## 2024-07-19 DIAGNOSIS — R350 Frequency of micturition: Secondary | ICD-10-CM

## 2024-07-19 DIAGNOSIS — I10 Essential (primary) hypertension: Secondary | ICD-10-CM | POA: Diagnosis not present

## 2024-07-19 DIAGNOSIS — R739 Hyperglycemia, unspecified: Secondary | ICD-10-CM

## 2024-07-19 DIAGNOSIS — Z0000 Encounter for general adult medical examination without abnormal findings: Secondary | ICD-10-CM

## 2024-07-19 DIAGNOSIS — E039 Hypothyroidism, unspecified: Secondary | ICD-10-CM

## 2024-07-19 DIAGNOSIS — R7309 Other abnormal glucose: Secondary | ICD-10-CM

## 2024-07-19 LAB — PSA: PSA: 0.66 ng/mL (ref 0.10–4.00)

## 2024-07-19 LAB — CBC WITH DIFFERENTIAL/PLATELET
Basophils Absolute: 0 K/uL (ref 0.0–0.1)
Basophils Relative: 0.8 % (ref 0.0–3.0)
Eosinophils Absolute: 0.4 K/uL (ref 0.0–0.7)
Eosinophils Relative: 7.8 % — ABNORMAL HIGH (ref 0.0–5.0)
HCT: 44.6 % (ref 39.0–52.0)
Hemoglobin: 15.5 g/dL (ref 13.0–17.0)
Lymphocytes Relative: 29.3 % (ref 12.0–46.0)
Lymphs Abs: 1.5 K/uL (ref 0.7–4.0)
MCHC: 34.7 g/dL (ref 30.0–36.0)
MCV: 97.5 fl (ref 78.0–100.0)
Monocytes Absolute: 0.3 K/uL (ref 0.1–1.0)
Monocytes Relative: 6.7 % (ref 3.0–12.0)
Neutro Abs: 2.8 K/uL (ref 1.4–7.7)
Neutrophils Relative %: 55.4 % (ref 43.0–77.0)
Platelets: 214 K/uL (ref 150.0–400.0)
RBC: 4.57 Mil/uL (ref 4.22–5.81)
RDW: 13.1 % (ref 11.5–15.5)
WBC: 5 K/uL (ref 4.0–10.5)

## 2024-07-19 LAB — T4, FREE: Free T4: 1.08 ng/dL (ref 0.60–1.60)

## 2024-07-19 LAB — TSH: TSH: 4.86 u[IU]/mL (ref 0.35–5.50)

## 2024-07-19 LAB — LIPID PANEL
Cholesterol: 172 mg/dL (ref 0–200)
HDL: 33.2 mg/dL — ABNORMAL LOW (ref >39.00)
LDL Cholesterol: 109 mg/dL — ABNORMAL HIGH (ref 0–99)
NonHDL: 138.41
Total CHOL/HDL Ratio: 5
Triglycerides: 145 mg/dL (ref 0.0–149.0)
VLDL: 29 mg/dL (ref 0.0–40.0)

## 2024-07-19 LAB — COMPREHENSIVE METABOLIC PANEL WITH GFR
ALT: 18 U/L (ref 0–53)
AST: 19 U/L (ref 0–37)
Albumin: 4.4 g/dL (ref 3.5–5.2)
Alkaline Phosphatase: 51 U/L (ref 39–117)
BUN: 14 mg/dL (ref 6–23)
CO2: 28 meq/L (ref 19–32)
Calcium: 9.4 mg/dL (ref 8.4–10.5)
Chloride: 102 meq/L (ref 96–112)
Creatinine, Ser: 1.06 mg/dL (ref 0.40–1.50)
GFR: 70.7 mL/min (ref >60.00)
Glucose, Bld: 87 mg/dL (ref 70–99)
Potassium: 4.3 meq/L (ref 3.5–5.1)
Sodium: 138 meq/L (ref 135–145)
Total Bilirubin: 0.8 mg/dL (ref 0.2–1.2)
Total Protein: 6.6 g/dL (ref 6.0–8.3)

## 2024-07-19 LAB — HEMOGLOBIN A1C: Hgb A1c MFr Bld: 5.3 % (ref 4.6–6.5)

## 2024-07-19 NOTE — Progress Notes (Signed)
 Assessment  Assessment/Plan:  Assessment and Plan Assessment & Plan Status post right shoulder rotator cuff and biceps tendon repair Recent surgery on the third, with no current issues. Healing well without complications. - Continue follow-up with orthopedics as needed.  Hyperlipidemia with intolerance to statins Intolerance to Crestor  due to dizziness, lightheadedness, and headaches. LDL reduced from 123 to 111 through lifestyle modifications. Triglycerides decreased from 163 to 90s. Prefers natural management over medication. - Continue lifestyle modifications to manage cholesterol levels. - Will consider referral to a cholesterol clinic if needed.  Stable coronary artery disease Minor coronary artery disease with slightly slow heart rate and low blood flow in one vein. Managed with low-dose aspirin. Cardiologist follow-up in October 2025 indicated stability. - Continue low-dose aspirin 81 mg daily. - Continue follow-up with cardiology as needed.  Essential hypertension Blood pressure well-controlled at 120/60 mmHg with perindopril  4 mg at night. Symptoms improved with nighttime dosing. - Continue perindopril  4 mg at night.  Benign prostatic hyperplasia with lower urinary tract symptoms Frequent urination attributed to age-related changes. Recent urology evaluation confirmed benign prostatic hyperplasia with no significant issues. - Continue monitoring urinary symptoms.  Hypothyroidism on levothyroxine  Long-standing hypothyroidism managed with levothyroxine  for 25 years. Last TSH check was a year ago. - Ordered TSH test to monitor thyroid  function.  History of multiple abdominal surgeries with mesh repair Multiple abdominal surgeries with mesh repairs due to complications from appendectomy. No current issues reported.  History of diverticulitis One flare managed with dietary modifications. No recent flares or need for antibiotics. - Continue dietary modifications to prevent  flare-ups.  General Health Maintenance Regular colonoscopy screenings every five years due to family history of colon cancer. Last colonoscopy in March 2025 showed four polyps, some precancerous. No recent PSA test available. - Ordered PSA test to monitor prostate health. - Continue regular colonoscopy screenings every five years.      There are no discontinued medications.  Patient Counseling(The following topics were reviewed and/or handout was given):  -Nutrition: Stressed importance of moderation in sodium/caffeine intake, saturated fat and cholesterol, caloric balance, sufficient intake of fresh fruits, vegetables, and fiber.  -Stressed the importance of regular exercise.   -Substance Abuse: Discussed cessation/primary prevention of tobacco, alcohol, or other drug use; driving or other dangerous activities under the influence; availability of treatment for abuse.   -Injury prevention: Discussed safety belts, safety helmets, smoke detector, smoking near bedding or upholstery.   -Sexuality: Discussed sexually transmitted diseases, partner selection, use of condoms, avoidance of unintended pregnancy and contraceptive alternatives.   -Dental health: Discussed importance of regular tooth brushing, flossing, and dental visits.  -Health maintenance and immunizations reviewed. Please refer to Health maintenance section.  Follow up in 6 months BP         Subjective:   Encounter date: 07/19/2024  Chief Complaint  Patient presents with   Transitions Of Care    Est care/physical   .  Discussed the use of AI scribe software for clinical note transcription with the patient, who gave verbal consent to proceed.  History of Present Illness Timothy Anthony is a 73 year old male who presents for transfer of care and follow-up on his medical conditions.  Abdominal surgical history and gastrointestinal symptoms - Status post appendectomy in mid-1990s complicated by peritonitis, requiring  six subsequent abdominal surgeries. - Multiple incisional hernia repairs with mesh; currently has three mesh patches in the abdomen. - Experiences nausea with anesthesia and severe gastrointestinal upset with pain medications, leading to  avoidance of analgesics. - History of diverticulitis with one prior flare managed conservatively without antibiotics.  Hyperlipidemia and statin intolerance - Initial LDL cholesterol 123 mg/dL, reduced to 888 mg/dL with lifestyle modifications. - Experienced dizziness and headaches with Crestor , leading to discontinuation. - Not currently taking cholesterol-lowering medications; prefers dietary and exercise management.  Cardiac conduction abnormality and aspirin use - Cardiologist identified mildly slowed cardiac conduction without evidence of blockage or major cardiac issues. - Takes low-dose aspirin daily. - No chest pain or shortness of breath.  Benign prostatic hyperplasia and nocturia - Nocturia occurring three to four times nightly. - Recent urology evaluation attributed symptoms to age-related changes; no significant interventions recommended.  Hypertension and beta-blocker therapy - Takes Pindolol 4 mg nightly for blood pressure management. - Initially experienced lightheadedness with Pindolol, now well-tolerated.  Thyroid  dysfunction - On levothyroxine  for thyroid  management for 25 years.  Colorectal cancer screening and family history - Father died of colon cancer at age 46. - Undergoes colonoscopy every five years; most recent in March revealed four polyps, some precancerous, all removed.  Postoperative shoulder recovery - Recovering from rotator cuff and biceps tendon repair performed on November 3rd. - No current issues with the shoulder. - Not taking pain medications for postoperative recovery.     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   Anxiety    Colon polyp    Coronary artery disease    Diverticulosis    Essential hypertension 08/24/2021   Excessive gas 05/28/2019   Gallstones    Gastroesophageal reflux disease without esophagitis 01/13/2016   Hyperlipidemia    Hypertension    Hypothyroidism    Labral tear of shoulder, degenerative, 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   Mixed hyperlipidemia 08/07/2020   Ventral hernia without obstruction or gangrene 08/24/2021    Patient Active Problem List   Diagnosis Date Noted   Statin intolerance 12/29/2023   Preop cardiovascular exam 09/20/2023   Palpitations 09/20/2023   Hypothyroidism    Hypertension    Hyperlipidemia    Gallstones    Diverticulosis    Coronary artery disease    Colon polyp    Anxiety    Labral tear of shoulder, degenerative, 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   Ventral hernia without obstruction or gangrene 08/24/2021   Essential hypertension 08/24/2021   Mixed hyperlipidemia 08/07/2020   Excessive gas 05/28/2019   Acquired hypothyroidism 01/31/2018   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   Gastroesophageal reflux disease without esophagitis 01/13/2016    Past Surgical History:  Procedure Laterality Date   APPENDECTOMY  08/17/1991   calcium  deposits removal Bilateral 09/2006 and 09/2015   off the eyelids   COLONOSCOPY     HERNIA REPAIR  1995,2003,2005,2006,2011   hernia repairs after each surgery    Family History  Problem Relation Age of Onset   Colon polyps Father  Celiac disease Father    Colon cancer Father    Cancer Brother     Heart disease Brother    Allergic rhinitis Neg Hx    Angioedema Neg Hx    Asthma Neg Hx    Eczema Neg Hx    Immunodeficiency Neg Hx    Urticaria Neg Hx    Esophageal cancer Neg Hx    Stomach cancer Neg Hx    Rectal cancer Neg Hx     Medications- reviewed and updated Outpatient Medications Prior to Visit  Medication Sig Dispense Refill   celecoxib (CELEBREX) 100 MG capsule Take 100 mg by mouth 2 (two) times daily.     levothyroxine  (SYNTHROID ) 150 MCG tablet TAKE 1 TABLET BY MOUTH DAILY  BEFORE BREAKFAST 90 tablet 3   ondansetron (ZOFRAN-ODT) 4 MG disintegrating tablet Take 4 mg by mouth.     perindopril  (ACEON ) 4 MG tablet TAKE 1 TABLET BY MOUTH DAILY 100 tablet 2   traMADol (ULTRAM) 50 MG tablet Take 50 mg by mouth every 6 (six) hours.     No facility-administered medications prior to visit.     Allergies  Allergen Reactions   Crestor  [Rosuvastatin  Calcium ]     Social History   Socioeconomic History   Marital status: Married    Spouse name: Not on file   Number of children: Not on file   Years of education: Not on file   Highest education level: 12th grade  Occupational History   Not on file  Tobacco Use   Smoking status: Former    Current packs/day: 0.00    Average packs/day: 0.5 packs/day for 6.0 years (3.0 ttl pk-yrs)    Types: Cigarettes    Start date: 08/20/1971    Quit date: 08/19/1977    Years since quitting: 46.9   Smokeless tobacco: Not on file  Vaping Use   Vaping status: Never Used  Substance and Sexual Activity   Alcohol use: Yes    Comment: rare in summers.   Drug use: No   Sexual activity: Yes  Other Topics Concern   Not on file  Social History Narrative   Not on file   Social Drivers of Health   Financial Resource Strain: Patient Declined (07/15/2024)   Overall Financial Resource Strain (CARDIA)    Difficulty of Paying Living Expenses: Patient declined  Food Insecurity: Patient Declined (07/15/2024)   Hunger Vital Sign    Worried About  Running Out of Food in the Last Year: Patient declined    Ran Out of Food in the Last Year: Patient declined  Transportation Needs: Patient Declined (07/15/2024)   PRAPARE - Administrator, Civil Service (Medical): Patient declined    Lack of Transportation (Non-Medical): Patient declined  Physical Activity: Unknown (07/15/2024)   Exercise Vital Sign    Days of Exercise per Week: Patient declined    Minutes of Exercise per Session: Not on file  Stress: Patient Declined (07/15/2024)   [employer] of Occupational Health - Occupational Stress Questionnaire    Feeling of Stress: Patient declined  Social Connections: Unknown (07/15/2024)   Social Connection and Isolation Panel    Frequency of Communication with Friends and Family: Patient declined    Frequency of Social Gatherings with Friends and Family: Patient declined    Attends Religious Services: Patient declined    Database Administrator or Organizations: No    Attends Engineer, Structural: Not on file    Marital Status: Patient declined  Objective:  Physical Exam: BP 120/60   Pulse 68   Temp 97.7 F (36.5 C)   Ht 6' 1 (1.854 m)   Wt 264 lb (119.7 kg)   SpO2 95%   BMI 34.83 kg/m   Body mass index is 34.83 kg/m. Wt Readings from Last 3 Encounters:  07/19/24 264 lb (119.7 kg)  06/05/24 250 lb 1.9 oz (113.5 kg)  05/31/24 252 lb (114.3 kg)    Physical Exam          Physical Exam Constitutional:      General: He is not in acute distress.    Appearance: Normal appearance. He is not ill-appearing or toxic-appearing.  HENT:     Head: Normocephalic and atraumatic.     Right Ear: Hearing, tympanic membrane, ear canal and external ear normal. There is no impacted cerumen.     Left Ear: Hearing, tympanic membrane, ear canal and external ear normal. There is no impacted cerumen.     Nose: Nose normal. No congestion.     Mouth/Throat:     Lips: No lesions.     Mouth: Mucous  membranes are moist.     Pharynx: Oropharynx is clear. No oropharyngeal exudate.  Eyes:     General: No scleral icterus.       Right eye: No discharge.        Left eye: No discharge.     Conjunctiva/sclera: Conjunctivae normal.     Pupils: Pupils are equal, round, and reactive to light.  Neck:     Thyroid : No thyroid  mass, thyromegaly or thyroid  tenderness.  Cardiovascular:     Rate and Rhythm: Normal rate and regular rhythm.     Pulses: Normal pulses.     Heart sounds: Normal heart sounds.  Pulmonary:     Effort: Pulmonary effort is normal. No respiratory distress.     Breath sounds: Normal breath sounds.  Abdominal:     General: Abdomen is flat. Bowel sounds are normal.     Palpations: Abdomen is soft.  Musculoskeletal:        General: Normal range of motion.     Cervical back: Normal range of motion.     Right lower leg: No edema.     Left lower leg: No edema.  Lymphadenopathy:     Cervical: No cervical adenopathy.  Skin:    General: Skin is warm and dry.     Findings: No rash.  Neurological:     General: No focal deficit present.     Mental Status: He is alert and oriented to person, place, and time. Mental status is at baseline.     Deep Tendon Reflexes:     Reflex Scores:      Patellar reflexes are 2+ on the right side and 2+ on the left side. Psychiatric:        Mood and Affect: Mood normal.        Behavior: Behavior normal.        Thought Content: Thought content normal.        Judgment: Judgment normal.        At today's visit, we discussed treatment options, associated risk and benefits, and engage in counseling as needed.  Additionally the following were reviewed: Past medical records, past medical and surgical history, family and social background, as well as relevant laboratory results, imaging findings, and specialty notes, where applicable.  This message was generated using dictation software, and as a result, it may contain unintentional typos or errors.  Nevertheless, extensive effort was made to accurately convey at the pertinent aspects of the patient visit.    There may have been are other unrelated non-urgent complaints, but due to the busy schedule and the amount of time already spent with him, time does not permit to address these issues at today's visit. Another appointment may have or has been requested to review these additional issues.     Beverley KATHEE Hummer, MD  I,Emily Lagle,acting as a scribe for Beverley KATHEE Hummer, MD.,have documented all relevant documentation on the behalf of Beverley KATHEE Hummer, MD.  LILLETTE Beverley KATHEE Hummer, MD, have reviewed all documentation for this visit. The documentation on 07/19/2024 for the exam, diagnosis, procedures, and orders are all accurate and complete.

## 2024-07-22 ENCOUNTER — Ambulatory Visit: Payer: Self-pay | Admitting: Family Medicine

## 2024-07-22 ENCOUNTER — Encounter: Payer: Self-pay | Admitting: Family Medicine

## 2024-07-22 DIAGNOSIS — E782 Mixed hyperlipidemia: Secondary | ICD-10-CM

## 2024-07-23 NOTE — Progress Notes (Signed)
 Last read by Zachary LILLETTE Socks at 6:40AM on 07/23/2024.

## 2024-08-11 ENCOUNTER — Other Ambulatory Visit: Payer: Self-pay | Admitting: Medical

## 2024-08-15 ENCOUNTER — Other Ambulatory Visit: Payer: Self-pay | Admitting: Family

## 2024-08-15 DIAGNOSIS — E782 Mixed hyperlipidemia: Secondary | ICD-10-CM

## 2024-08-15 DIAGNOSIS — I10 Essential (primary) hypertension: Secondary | ICD-10-CM

## 2024-08-29 ENCOUNTER — Telehealth: Payer: Self-pay | Admitting: Family Medicine

## 2024-08-29 NOTE — Telephone Encounter (Signed)
 Patient has [facility] appt 09/03/24  He wanted to know how long before he can have another cholesterol test   he has been working on diet and exercise  since his last appt in dec   He wanted to know if he could have this checked when he is in office 09/03/24  Please advise

## 2024-08-30 ENCOUNTER — Other Ambulatory Visit: Payer: Self-pay | Admitting: Family Medicine

## 2024-08-30 DIAGNOSIS — Z0000 Encounter for general adult medical examination without abnormal findings: Secondary | ICD-10-CM

## 2024-08-30 DIAGNOSIS — I10 Essential (primary) hypertension: Secondary | ICD-10-CM

## 2024-08-30 DIAGNOSIS — E782 Mixed hyperlipidemia: Secondary | ICD-10-CM

## 2024-09-03 ENCOUNTER — Other Ambulatory Visit (INDEPENDENT_AMBULATORY_CARE_PROVIDER_SITE_OTHER)

## 2024-09-03 ENCOUNTER — Ambulatory Visit: Payer: Self-pay | Admitting: Family Medicine

## 2024-09-03 ENCOUNTER — Ambulatory Visit

## 2024-09-03 VITALS — BP 116/62 | HR 72 | Temp 98.2°F | Ht 73.0 in | Wt 263.8 lb

## 2024-09-03 DIAGNOSIS — E782 Mixed hyperlipidemia: Secondary | ICD-10-CM | POA: Diagnosis not present

## 2024-09-03 DIAGNOSIS — Z0000 Encounter for general adult medical examination without abnormal findings: Secondary | ICD-10-CM | POA: Diagnosis not present

## 2024-09-03 DIAGNOSIS — I10 Essential (primary) hypertension: Secondary | ICD-10-CM

## 2024-09-03 LAB — COMPREHENSIVE METABOLIC PANEL WITH GFR
ALT: 14 U/L (ref 3–53)
AST: 19 U/L (ref 5–37)
Albumin: 4.3 g/dL (ref 3.5–5.2)
Alkaline Phosphatase: 55 U/L (ref 39–117)
BUN: 14 mg/dL (ref 6–23)
CO2: 27 meq/L (ref 19–32)
Calcium: 9.5 mg/dL (ref 8.4–10.5)
Chloride: 105 meq/L (ref 96–112)
Creatinine, Ser: 1.07 mg/dL (ref 0.40–1.50)
GFR: 69.84 mL/min
Glucose, Bld: 92 mg/dL (ref 70–99)
Potassium: 4.4 meq/L (ref 3.5–5.1)
Sodium: 139 meq/L (ref 135–145)
Total Bilirubin: 0.8 mg/dL (ref 0.2–1.2)
Total Protein: 6.8 g/dL (ref 6.0–8.3)

## 2024-09-03 LAB — LIPID PANEL
Cholesterol: 156 mg/dL (ref 28–200)
HDL: 30.4 mg/dL — ABNORMAL LOW
LDL Cholesterol: 104 mg/dL — ABNORMAL HIGH (ref 10–99)
NonHDL: 125.34
Total CHOL/HDL Ratio: 5
Triglycerides: 108 mg/dL (ref 10.0–149.0)
VLDL: 21.6 mg/dL (ref 0.0–40.0)

## 2024-09-03 NOTE — Patient Instructions (Signed)
 Mr. Timothy Anthony,  Thank you for taking the time for your Medicare Wellness Visit. I appreciate your continued commitment to your health goals. Please review the care plan we discussed, and feel free to reach out if I can assist you further.  Please note that Annual Wellness Visits do not include a physical exam. Some assessments may be limited, especially if the visit was conducted virtually. If needed, we may recommend an in-person follow-up with your provider.  Ongoing Care Seeing your primary care provider every 3 to 6 months helps us  monitor your health and provide consistent, personalized care.   Referrals If a referral was made during today's visit and you haven't received any updates within two weeks, please contact the referred provider directly to check on the status.  Recommended Screenings:  Health Maintenance  Topic Date Due   Hepatitis C Screening  Never done   DTaP/Tdap/Td vaccine (1 - Tdap) Never done   Zoster (Shingles) Vaccine (1 of 2) Never done   Medicare Annual Wellness Visit  11/22/2023   COVID-19 Vaccine (2 - 2025-26 season) 04/16/2024   Flu Shot  11/13/2024*   Colon Cancer Screening  11/03/2028   Meningitis B Vaccine  Aged Out   Pneumococcal Vaccine for age over 31  Discontinued  *Topic was postponed. The date shown is not the original due date.       09/03/2024    8:46 AM  Advanced Directives  Does Patient Have a Medical Advance Directive? Yes  Type of Advance Directive Living will    Vision: Annual vision screenings are recommended for early detection of glaucoma, cataracts, and diabetic retinopathy. These exams can also reveal signs of chronic conditions such as diabetes and high blood pressure.  Dental: Annual dental screenings help detect early signs of oral cancer, gum disease, and other conditions linked to overall health, including heart disease and diabetes.  Please see the attached documents for additional preventive care recommendations.

## 2024-09-03 NOTE — Progress Notes (Signed)
 "  Chief Complaint  Patient presents with   Medicare Wellness     Subjective:   Timothy Anthony is a 74 y.o. male who presents for a Medicare Annual Wellness Visit.  Visit info / Clinical Intake: Medicare Wellness Visit Type:: Subsequent Annual Wellness Visit Persons participating in visit and providing information:: patient Medicare Wellness Visit Mode:: In-person (required for WTM) Interpreter Needed?: No AWV questionnaire completed by patient prior to visit?: no Living arrangements:: lives with spouse/significant other Patient's Overall Health Status Rating: good Typical amount of pain: some Does pain affect daily life?: no Are you currently prescribed opioids?: no  Dietary Habits and Nutritional Risks How many meals a day?: 3 Eats fruit and vegetables daily?: yes Most meals are obtained by: preparing own meals In the last 2 weeks, have you had any of the following?: none Diabetic:: no  Functional Status Activities of Daily Living (to include ambulation/medication): Independent Ambulation: Independent Medication Administration: Independent Home Management (perform basic housework or laundry): Independent Manage your own finances?: yes Primary transportation is: driving Concerns about vision?: no *vision screening is required for WTM* Concerns about hearing?: (!) yes (tinnitus, 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) Uses hearing aids?: no (declines)  Fall Screening Falls in the past year?: 0 Number of falls in past year: 0 Was there an injury with Fall?: 0 Fall Risk Category Calculator: 0 Patient Fall Risk Level: Low Fall Risk  Fall Risk Patient at Risk for Falls Due to: Medication side effect Fall risk Follow up: Falls prevention discussed; Education provided; Falls evaluation completed  Home and Transportation Safety: All rugs have non-skid backing?: yes All stairs or steps have railings?: N/A, no stairs Grab bars in the bathtub or shower?: yes Have non-skid surface in bathtub or  shower?: yes Good home lighting?: yes Regular seat belt use?: yes Hospital stays in the last year:: no  Cognitive Assessment Difficulty concentrating, remembering, or making decisions? : no Will 6CIT or Mini Cog be Completed: yes What year is it?: 0 points What month is it?: 0 points Give patient an address phrase to remember (5 components): [address] [place] About what time is it?: 0 points Count backwards from 20 to 1: 0 points Say the months of the year in reverse: 0 points Repeat the address phrase from earlier: 0 points 6 CIT Score: 0 points  Advance Directives (For Healthcare) Does Patient Have a Medical Advance Directive?: Yes Type of Advance Directive: Living will Copy of Living Will in Chart?: No - copy requested  Reviewed/Updated  Reviewed/Updated: Reviewed All (Medical, Surgical, Family, Medications, Allergies, Care Teams, Patient Goals)    Allergies (verified) Crestor  [rosuvastatin  calcium ]   Current Medications (verified) Outpatient Encounter Medications as of 09/03/2024  Medication Sig   levothyroxine  (SYNTHROID ) 150 MCG tablet TAKE 1 TABLET BY MOUTH DAILY  BEFORE BREAKFAST   perindopril  (ACEON ) 4 MG tablet TAKE 1 TABLET BY MOUTH DAILY   No facility-administered encounter medications on file as of 09/03/2024.    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   Anxiety    Arthritis    Cataract    Colon polyp    Coronary artery disease    Diverticulosis    Essential hypertension 08/24/2021   Excessive gas 05/28/2019   Gallstones    Gastroesophageal reflux disease without esophagitis 01/13/2016   Hyperlipidemia    Hypertension    Hypothyroidism    Labral tear of shoulder, degenerative, right  11/22/2022   Mixed hyperlipidemia 08/07/2020   Ventral hernia without obstruction or gangrene 08/24/2021   Past Surgical History:  Procedure Laterality Date    APPENDECTOMY  08/17/1991   calcium  deposits removal Bilateral 09/2006 and 09/2015   off the eyelids   CHOLECYSTECTOMY     COLONOSCOPY     EYE SURGERY     HERNIA REPAIR  1995,2003,2005,2006,2011   hernia repairs after each surgery   ROTATOR CUFF REPAIR Right 06/18/2024   Dr. Spence   Family History  Problem Relation Age of Onset   Colon polyps Father    Celiac disease Father    Colon cancer Father    Cancer Brother    Heart disease Brother    Allergic rhinitis Neg Hx    Angioedema Neg Hx    Asthma Neg Hx    Eczema Neg Hx    Immunodeficiency Neg Hx    Urticaria Neg Hx    Esophageal cancer Neg Hx    Stomach cancer Neg Hx    Rectal cancer Neg Hx    Social History   Occupational History   Not on file  Tobacco Use   Smoking status: Former    Current packs/day: 0.00    Average packs/day: 0.5 packs/day for 6.0 years (3.0 ttl pk-yrs)    Types: Cigarettes    Start date: 08/20/1971    Quit date: 08/19/1977    Years since quitting: 47.0   Smokeless tobacco: Not on file  Vaping Use   Vaping status: Never Used  Substance and Sexual Activity   Alcohol use: Not Currently    Comment: occasional   Drug use: No   Sexual activity: Yes   Tobacco Counseling Counseling given: Not Answered  SDOH Screenings   Food Insecurity: No Food Insecurity (09/03/2024)  Housing: Unknown (09/03/2024)  Transportation Needs: No Transportation Needs (09/03/2024)  Utilities: Not At Risk (09/03/2024)  Alcohol Screen: Low Risk (09/03/2024)  Depression (PHQ2-9): Low Risk (09/03/2024)  Financial Resource Strain: Low Risk (09/03/2024)  Physical Activity: Sufficiently Active (09/03/2024)  Social Connections: Moderately Integrated (09/03/2024)  Stress: No Stress Concern Present (09/03/2024)  Tobacco Use: Medium Risk (09/03/2024)  Health Literacy: Adequate Health Literacy (09/03/2024)   See flowsheets for full screening details  Depression Screen PHQ 2 & 9 Depression Scale- Over the past 2 weeks, how often have  you been bothered by any of the following problems? Little interest or pleasure in doing things: 0 Feeling down, depressed, or hopeless (PHQ Adolescent also includes...irritable): 0 PHQ-2 Total Score: 0 Trouble falling or staying asleep, or sleeping too much: 0 Feeling tired or having little energy: 0 Poor appetite or overeating (PHQ Adolescent also includes...weight loss): 0 Feeling bad about yourself - or that you are a failure or have let yourself or your family down: 0 Trouble concentrating on things, such as reading the newspaper or watching television (PHQ Adolescent also includes...like school work): 0 Moving or speaking so slowly that other people could have noticed. Or the opposite - being so fidgety or restless that you have been moving around a lot more than usual: 0 Thoughts that you would be better off dead, or of hurting yourself in some way: 0 PHQ-9 Total Score: 0 If you checked off any problems, how difficult have these problems made it for you to do your work, take care of things at home, or get along with other people?: Not difficult at all  Depression Treatment Depression Interventions/Treatment : EYV7-0 Score <4 Follow-up Not Indicated  Goals Addressed               This Visit's Progress     Get shoulder back to normal (pt-stated)               Objective:    Today's Vitals   09/03/24 0841  BP: 116/62  Pulse: 72  Temp: 98.2 F (36.8 C)  TempSrc: Oral  SpO2: 97%  Weight: 263 lb 12.8 oz (119.7 kg)  Height: 6' 1 (1.854 m)   Body mass index is 34.8 kg/m.  Hearing/Vision screen Vision Screening - Comments:: Regular eye exams, [employer]'s Best Immunizations and Health Maintenance Health Maintenance  Topic Date Due   Hepatitis C Screening  Never done   DTaP/Tdap/Td (1 - Tdap) Never done   Zoster Vaccines- Shingrix (1 of 2) Never done   COVID-19 Vaccine (2 - 2025-26 season) 04/16/2024   Influenza Vaccine  11/13/2024 (Originally 03/16/2024)    Medicare Annual Wellness (AWV)  09/03/2025   Colonoscopy  11/03/2028   Meningococcal B Vaccine  Aged Out   Pneumococcal Vaccine: 50+ Years  Discontinued        Assessment/Plan:  This is a routine wellness examination for Bobie.  Patient Care Team: Sebastian Beverley NOVAK, MD as PCP - General (Family Medicine) Madireddy, Alean SAUNDERS, MD as Consulting Physician (Cardiology)  I have personally reviewed and noted the following in the patients chart:   Medical and social history Use of alcohol, tobacco or illicit drugs  Current medications and supplements including opioid prescriptions. Functional ability and status Nutritional status Physical activity Advanced directives List of other physicians Hospitalizations, surgeries, and ER visits in previous 12 months Vitals Screenings to include cognitive, depression, and falls Referrals and appointments  No orders of the defined types were placed in this encounter.  In addition, I have reviewed and discussed with patient certain preventive protocols, quality metrics, and best practice recommendations. A written personalized care plan for preventive services as well as general preventive health recommendations were provided to patient.   Ardella FORBES Dawn, LPN   8/80/7973   Return in 1 year (on 09/03/2025).  After Visit Summary: (In Person-Declined) Patient declined AVS at this time.  Nurse Notes: Vaccines not given: covid, shingles and TDAP declined today  "

## 2024-09-05 ENCOUNTER — Encounter: Payer: Self-pay | Admitting: Nurse Practitioner

## 2024-09-05 ENCOUNTER — Ambulatory Visit (INDEPENDENT_AMBULATORY_CARE_PROVIDER_SITE_OTHER): Admitting: Nurse Practitioner

## 2024-09-05 ENCOUNTER — Ambulatory Visit: Payer: Self-pay

## 2024-09-05 VITALS — BP 126/72 | HR 74 | Temp 97.8°F | Ht 73.0 in | Wt 264.0 lb

## 2024-09-05 DIAGNOSIS — H8111 Benign paroxysmal vertigo, right ear: Secondary | ICD-10-CM | POA: Insufficient documentation

## 2024-09-05 NOTE — Telephone Encounter (Signed)
 FYI Only or Action Required?: FYI only for provider: appointment scheduled on today.  Patient was last seen in primary care on 07/19/2024 by Timothy Beverley NOVAK, MD.  Called Nurse Triage reporting Dizziness.  Symptoms began today.  Interventions attempted: Rest, hydration, or home remedies.  Symptoms are: resolved but recurs if laying down.  Triage Disposition: See Physician Within 24 Hours  Patient/caregiver understands and will follow disposition?: Yes  Reason for Disposition  [1] NO dizziness now AND [2] one or more stroke risk factors (i.e., hypertension, diabetes, prior stroke/TIA/heart attack)  Answer Assessment - Initial Assessment Questions Patient calling to scheduled an appointment to evaluate and treat vertigo. Symptom onset this morning of vertigo and nausea. He has a history of vertigo and received therapy treatment 8-10 years ago without recurrence. He describes the sensation as room flashes or tilting when he moves his head while supine. Nausea and vertigo resolve once sitting upright, recurs if he lays back down and moves head. Currently asymptomatic.  Vitals taken while on triage call 149/85, pulse 67. No missed doses of medication. Denies all other symptoms.   1. DESCRIPTION: Describe your dizziness.     Vertigo  2. VERTIGO: Do you feel like either you or the room is spinning or tilting?      Room flashes, tilting 3. LIGHTHEADED: Do you feel lightheaded? (e.g., somewhat faint, woozy, weak upon standing)     Does not feel faint  4. SEVERITY: How bad is it?  Can you walk?     Able to walk.  5. ONSET:  When did the dizziness begin?     today 6. AGGRAVATING FACTORS: Does anything make it worse? (e.g., standing, change in head position)     When laying down if he moves his head this brings on vertigo and nausea, when upright all symptoms resolve.  7. CAUSE: What do you think is causing the dizziness?     Vertigo  8. RECURRENT SYMPTOM: Have you had  dizziness before? If Yes, ask: When was the last time? What happened that time?     8-10 years ago had therapy for vertigo 9. OTHER SYMPTOMS: Do you have any other symptoms? (e.g., earache, headache, numbness, tinnitus, vomiting, weakness)     Nausea has now resolved  Protocols used: Dizziness - Vertigo-A-AH Message from Lashmeet H sent at 09/05/2024 11:32 AM EST  Reason for Triage: Possible vertigo issues, dizziness laying down and getting up, eyes start flashing back and forth has to sit straight up on chair to stop nausea.

## 2024-09-05 NOTE — Patient Instructions (Signed)
 It was great to see you!  Start the maneuvers we discussed at your visit  If you are still having symptoms, please send us  a message   You can take non-drowsy dramamine as needed   Let's follow-up if symptoms worsen or any concerns   Take care,  Tinnie Harada, NP

## 2024-09-05 NOTE — Assessment & Plan Note (Signed)
 Recurrent vertigo episodes, primarily affecting the right ear, likely result from displaced otoliths. Orthostatic blood pressure negative. Perform the Epley maneuver at home, starting with the right side. Ensure adequate hydration. Consider using OTC nondrowsy Dramamine for dizziness if needed. Contact the clinic if symptoms persist for a potential vestibular therapy referral.

## 2024-09-05 NOTE — Telephone Encounter (Signed)
 Noted. Dm/cma

## 2024-09-05 NOTE — Progress Notes (Signed)
 "  Acute Visit  BP 126/72 (BP Location: Left Arm, Patient Position: Sitting, Cuff Size: Large)   Pulse 74   Temp 97.8 F (36.6 C)   Ht 6' 1 (1.854 m)   Wt 264 lb (119.7 kg)   SpO2 96%   BMI 34.83 kg/m    Subjective:    Patient ID: Timothy Anthony, male    DOB: 06-23-1951, 74 y.o.   MRN: [id number]  CC: Chief Complaint  Patient presents with   Dizziness    Woke up this morning and felt dizzy, happens when laying on side    HPI: Timothy Anthony is a 74 y.o. male presents for vertigo and nausea onset this morning. He does have a history of  vertigo with therapy treatment about a decade ago without recurrence until now. Symptoms improved while sitting upright, but recurs when laying down or moving head.   Discussed the use of AI scribe software for clinical note transcription with the patient, who gave verbal consent to proceed.  History of Present Illness   Timothy Anthony is a 74 year old male with a history of vertigo who presents with dizziness.  For the past couple of weeks he has had recurrent dizziness similar to prior vertigo episodes, with a marked increase in intensity this morning. Symptoms are worst when lying down and on waking. He also had dizziness and lightheadedness when getting up from lying under a car in his garage. This mornings episode lasted 20 to 30 minutes and improved when he sat still.  He previously received head-maneuver therapy for vertigo but does not recall the name. He reports chronic hearing difficulty and tinnitus. He denies recent illness, nasal congestion, sore throat, ear pain, chest pain, shortness of breath, or recent falls. He checks his blood pressure daily at home and says it is usually stable.     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   Anxiety    Arthritis    Cataract    Colon polyp    Coronary artery disease    Diverticulosis    Essential  hypertension 08/24/2021   Excessive gas 05/28/2019   Gallstones    Gastroesophageal reflux disease without esophagitis 01/13/2016   Hyperlipidemia    Hypertension    Hypothyroidism    Labral tear of shoulder, degenerative, 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   Mixed hyperlipidemia 08/07/2020   Ventral hernia without obstruction or gangrene 08/24/2021    Past Surgical History:  Procedure Laterality Date   APPENDECTOMY  08/17/1991   calcium  deposits removal Bilateral 09/2006 and 09/2015   off the eyelids   CHOLECYSTECTOMY     COLONOSCOPY     EYE SURGERY     HERNIA REPAIR  1995,2003,2005,2006,2011   hernia repairs after each surgery   ROTATOR CUFF REPAIR Right 06/18/2024   Dr. Spence    Family History  Problem Relation Age of Onset   Colon polyps Father    Celiac disease Father    Colon cancer Father    Cancer Brother    Heart disease Brother    Allergic rhinitis Neg Hx    Angioedema Neg Hx    Asthma Neg Hx    Eczema Neg Hx    Immunodeficiency Neg Hx    Urticaria Neg Hx    Esophageal cancer Neg Hx    Stomach cancer Neg Hx    Rectal cancer Neg Hx      Social History[1]  Medications Ordered Prior to Encounter[2]   Review of Systems See pertinent positives and negatives per HPI.     Objective:    BP 126/72 (BP Location: Left Arm, Patient Position: Sitting, Cuff Size: Large)   Pulse 74   Temp 97.8 F (36.6 C)   Ht 6' 1 (1.854 m)   Wt 264 lb (119.7 kg)   SpO2 96%   BMI 34.83 kg/m   Wt Readings from Last 3 Encounters:  09/05/24 264 lb (119.7 kg)  09/03/24 263 lb 12.8 oz (119.7 kg)  07/19/24 264 lb (119.7 kg)    BP Readings from Last 3 Encounters:  09/05/24 126/72  09/03/24 116/62  07/19/24 120/60    Orthostatic VS for the past 72 hrs (Last 3 readings):  Orthostatic BP Patient Position BP Location Cuff Size Orthostatic Pulse  09/05/24 1330 118/64 Standing Right Arm Large 76  09/05/24 1328 128/82 Sitting Right Arm Large 80  09/05/24 1326 122/72 Supine Right Arm Large 74     Physical Exam Vitals and nursing note reviewed.  Constitutional:      Appearance: Normal appearance.  HENT:     Head: Normocephalic.     Right Ear: Tympanic membrane, ear canal and external ear normal.     Left Ear: Tympanic membrane, ear canal and external ear normal.  Eyes:     Conjunctiva/sclera: Conjunctivae normal.  Cardiovascular:     Rate and Rhythm: Normal rate and regular rhythm.     Pulses: Normal pulses.     Heart sounds: Normal heart sounds.  Pulmonary:     Effort: Pulmonary effort is normal.     Breath sounds: Normal breath sounds.  Musculoskeletal:     Cervical back: Normal range of motion.  Skin:    General: Skin is warm.  Neurological:     General: No focal deficit present.     Mental Status: He is alert and oriented to person, place, and time.     Comments: Dix-hallpike maneuver positive on right   Psychiatric:        Mood and Affect: Mood normal.        Behavior: Behavior normal.        Thought Content: Thought content normal.        Judgment: Judgment normal.       Assessment & Plan:   Problem List Items Addressed This Visit       Nervous and Auditory   Benign paroxysmal positional vertigo of right ear - Primary   Recurrent vertigo episodes, primarily affecting the right ear, likely result from displaced otoliths. Orthostatic blood pressure negative. Perform the Epley maneuver at home, starting with the right side. Ensure adequate hydration. Consider using OTC nondrowsy Dramamine for dizziness if needed. Contact the clinic if symptoms persist for a potential vestibular therapy referral.         Follow up plan: Return if symptoms worsen or fail to improve.  Tinnie DELENA Harada, NP  I,Emily Lagle,acting as a scribe for [employer], NP.,have documented all relevant documentation on the behalf of Dalten Ambrosino DELENA Harada, NP.  I, Tinnie DELENA Harada, NP, have reviewed all documentation for this visit. The documentation on 09/05/2024 for the exam,  diagnosis, procedures, and orders are all accurate and complete.     [1]  Social History Tobacco Use   Smoking status: Former    Current packs/day: 0.00    Average packs/day: 0.5 packs/day for 6.0 years (3.0 ttl pk-yrs)    Types: Cigarettes    Start  date: 08/20/1971    Quit date: 08/19/1977    Years since quitting: 47.0  Vaping Use   Vaping status: Never Used  Substance Use Topics   Alcohol use: Not Currently    Comment: occasional   Drug use: No  [2]  Current Outpatient Medications on File Prior to Visit  Medication Sig Dispense Refill   levothyroxine  (SYNTHROID ) 150 MCG tablet TAKE 1 TABLET BY MOUTH DAILY  BEFORE BREAKFAST 90 tablet 3   perindopril  (ACEON ) 4 MG tablet TAKE 1 TABLET BY MOUTH DAILY 100 tablet 2   No current facility-administered medications on file prior to visit.   "

## 2025-09-09 ENCOUNTER — Ambulatory Visit
